# Patient Record
Sex: Female | Born: 1945
Health system: Southern US, Community
[De-identification: ages and names within clinical notes are randomized; demographics above are authoritative.]

## PROBLEM LIST (undated history)

## (undated) DIAGNOSIS — E119 Type 2 diabetes mellitus without complications: Secondary | ICD-10-CM

## (undated) DIAGNOSIS — K5790 Diverticulosis of intestine, part unspecified, without perforation or abscess without bleeding: Secondary | ICD-10-CM

## (undated) DIAGNOSIS — M170 Bilateral primary osteoarthritis of knee: Secondary | ICD-10-CM

## (undated) DIAGNOSIS — D126 Benign neoplasm of colon, unspecified: Secondary | ICD-10-CM

## (undated) DIAGNOSIS — M1990 Unspecified osteoarthritis, unspecified site: Secondary | ICD-10-CM

## (undated) DIAGNOSIS — F418 Other specified anxiety disorders: Secondary | ICD-10-CM

## (undated) DIAGNOSIS — Z9889 Other specified postprocedural states: Secondary | ICD-10-CM

## (undated) DIAGNOSIS — I10 Essential (primary) hypertension: Secondary | ICD-10-CM

## (undated) DIAGNOSIS — E66812 Obesity, class 2: Secondary | ICD-10-CM

## (undated) DIAGNOSIS — E669 Obesity, unspecified: Secondary | ICD-10-CM

## (undated) DIAGNOSIS — E785 Hyperlipidemia, unspecified: Secondary | ICD-10-CM

## (undated) HISTORY — DX: Hypercalcemia: E83.52

## (undated) HISTORY — DX: Other specified postprocedural states: Z98.890

## (undated) HISTORY — DX: Unspecified osteoarthritis, unspecified site: M19.90

## (undated) HISTORY — DX: Type 2 diabetes mellitus without complications: E11.9

## (undated) HISTORY — DX: Hyperlipidemia, unspecified: E78.5

## (undated) HISTORY — DX: Essential (primary) hypertension: I10

## (undated) HISTORY — PX: TONSILLECTOMY: SUR1361

## (undated) HISTORY — DX: Diverticulosis of intestine, part unspecified, without perforation or abscess without bleeding: K57.90

## (undated) HISTORY — DX: Benign neoplasm of colon, unspecified: D12.6

## (undated) HISTORY — PX: OTHER SURGICAL HISTORY: SHX169

## (undated) HISTORY — DX: Obesity, class 2: E66.812

## (undated) HISTORY — DX: Bilateral primary osteoarthritis of knee: M17.0

## (undated) HISTORY — DX: Other specified anxiety disorders: F41.8

## (undated) HISTORY — DX: Obesity, unspecified: E66.9

---

## 1995-12-04 HISTORY — PX: CHOLECYSTECTOMY: SHX55

## 1997-12-03 DIAGNOSIS — Z9889 Other specified postprocedural states: Secondary | ICD-10-CM

## 1997-12-03 HISTORY — DX: Other specified postprocedural states: Z98.890

## 2004-02-01 ENCOUNTER — Encounter: Admission: RE | Admit: 2004-02-01 | Discharge: 2004-02-01 | Payer: Self-pay | Admitting: Family Medicine

## 2004-03-06 ENCOUNTER — Other Ambulatory Visit: Admission: RE | Admit: 2004-03-06 | Discharge: 2004-03-06 | Payer: Self-pay | Admitting: Family Medicine

## 2005-03-08 ENCOUNTER — Ambulatory Visit: Payer: Self-pay | Admitting: Family Medicine

## 2005-03-22 ENCOUNTER — Other Ambulatory Visit: Admission: RE | Admit: 2005-03-22 | Discharge: 2005-03-22 | Payer: Self-pay | Admitting: Family Medicine

## 2005-03-22 ENCOUNTER — Ambulatory Visit: Payer: Self-pay | Admitting: Family Medicine

## 2005-03-29 ENCOUNTER — Encounter: Admission: RE | Admit: 2005-03-29 | Discharge: 2005-03-29 | Payer: Self-pay | Admitting: Family Medicine

## 2006-03-18 ENCOUNTER — Ambulatory Visit: Payer: Self-pay | Admitting: Family Medicine

## 2006-03-25 ENCOUNTER — Other Ambulatory Visit: Admission: RE | Admit: 2006-03-25 | Discharge: 2006-03-25 | Payer: Self-pay | Admitting: Family Medicine

## 2006-03-25 ENCOUNTER — Ambulatory Visit: Payer: Self-pay | Admitting: Family Medicine

## 2006-03-25 ENCOUNTER — Encounter: Payer: Self-pay | Admitting: Family Medicine

## 2006-04-03 ENCOUNTER — Encounter: Admission: RE | Admit: 2006-04-03 | Discharge: 2006-04-03 | Payer: Self-pay | Admitting: Family Medicine

## 2007-03-21 ENCOUNTER — Ambulatory Visit: Payer: Self-pay | Admitting: Family Medicine

## 2007-03-21 LAB — CONVERTED CEMR LAB
AST: 21 units/L (ref 0–37)
Albumin: 3.9 g/dL (ref 3.5–5.2)
Basophils Absolute: 0 10*3/uL (ref 0.0–0.1)
Bilirubin, Direct: 0.1 mg/dL (ref 0.0–0.3)
CO2: 29 meq/L (ref 19–32)
Chloride: 107 meq/L (ref 96–112)
Cholesterol: 173 mg/dL (ref 0–200)
Creatinine, Ser: 0.6 mg/dL (ref 0.4–1.2)
Eosinophils Absolute: 0.2 10*3/uL (ref 0.0–0.6)
Glucose, Bld: 113 mg/dL — ABNORMAL HIGH (ref 70–99)
HDL: 39.8 mg/dL (ref >39.0)
Hemoglobin: 14.1 g/dL (ref 12.0–15.0)
Lymphocytes Relative: 30.6 % (ref 12.0–46.0)
MCHC: 32.8 g/dL (ref 30.0–36.0)
MCV: 81.1 fL (ref 78.0–100.0)
Monocytes Absolute: 0.5 10*3/uL (ref 0.2–0.7)
Monocytes Relative: 11 % (ref 3.0–11.0)
Neutro Abs: 2.3 10*3/uL (ref 1.4–7.7)
Platelets: 132 10*3/uL — ABNORMAL LOW (ref 150–400)
Sodium: 143 meq/L (ref 135–145)
TSH: 2.73 microintl units/mL (ref 0.35–5.50)
Total Bilirubin: 0.8 mg/dL (ref 0.3–1.2)
Total Protein: 6.9 g/dL (ref 6.0–8.3)
Triglycerides: 104 mg/dL (ref 0–149)
VLDL: 21 mg/dL (ref 0–40)

## 2007-03-31 ENCOUNTER — Encounter: Payer: Self-pay | Admitting: Family Medicine

## 2007-03-31 ENCOUNTER — Other Ambulatory Visit: Admission: RE | Admit: 2007-03-31 | Discharge: 2007-03-31 | Payer: Self-pay | Admitting: Family Medicine

## 2007-03-31 ENCOUNTER — Ambulatory Visit: Payer: Self-pay | Admitting: Family Medicine

## 2007-04-15 ENCOUNTER — Encounter: Admission: RE | Admit: 2007-04-15 | Discharge: 2007-04-15 | Payer: Self-pay | Admitting: Family Medicine

## 2008-01-05 ENCOUNTER — Telehealth: Payer: Self-pay | Admitting: Family Medicine

## 2008-03-26 ENCOUNTER — Ambulatory Visit: Payer: Self-pay | Admitting: Family Medicine

## 2008-03-26 LAB — CONVERTED CEMR LAB
ALT: 41 units/L — ABNORMAL HIGH (ref 0–35)
AST: 30 units/L (ref 0–37)
Albumin: 4 g/dL (ref 3.5–5.2)
Alkaline Phosphatase: 67 units/L (ref 39–117)
BUN: 12 mg/dL (ref 6–23)
Blood in Urine, dipstick: NEGATIVE
CO2: 29 meq/L (ref 19–32)
Chloride: 106 meq/L (ref 96–112)
Eosinophils Relative: 2.8 % (ref 0.0–5.0)
Glucose, Bld: 123 mg/dL — ABNORMAL HIGH (ref 70–99)
HCT: 43.9 % (ref 36.0–46.0)
HDL: 38.8 mg/dL — ABNORMAL LOW (ref >39.0)
Monocytes Absolute: 0.4 10*3/uL (ref 0.1–1.0)
Monocytes Relative: 8.2 % (ref 3.0–12.0)
Neutrophils Relative %: 53.4 % (ref 43.0–77.0)
Nitrite: NEGATIVE
Platelets: 140 10*3/uL — ABNORMAL LOW (ref 150–400)
Potassium: 4.3 meq/L (ref 3.5–5.1)
Specific Gravity, Urine: >=1.03
Total CHOL/HDL Ratio: 4.1
Total Protein: 7 g/dL (ref 6.0–8.3)
WBC Urine, dipstick: NEGATIVE
WBC: 4.4 10*3/uL — ABNORMAL LOW (ref 4.5–10.5)

## 2008-04-02 ENCOUNTER — Encounter: Payer: Self-pay | Admitting: Family Medicine

## 2008-04-02 ENCOUNTER — Ambulatory Visit: Payer: Self-pay | Admitting: Family Medicine

## 2008-04-02 ENCOUNTER — Other Ambulatory Visit: Admission: RE | Admit: 2008-04-02 | Discharge: 2008-04-02 | Payer: Self-pay | Admitting: Family Medicine

## 2008-04-02 DIAGNOSIS — E119 Type 2 diabetes mellitus without complications: Secondary | ICD-10-CM

## 2008-04-02 DIAGNOSIS — K8020 Calculus of gallbladder without cholecystitis without obstruction: Secondary | ICD-10-CM | POA: Insufficient documentation

## 2008-04-02 DIAGNOSIS — F3289 Other specified depressive episodes: Secondary | ICD-10-CM | POA: Insufficient documentation

## 2008-04-02 DIAGNOSIS — F329 Major depressive disorder, single episode, unspecified: Secondary | ICD-10-CM

## 2008-04-02 DIAGNOSIS — I10 Essential (primary) hypertension: Secondary | ICD-10-CM

## 2008-04-02 DIAGNOSIS — E785 Hyperlipidemia, unspecified: Secondary | ICD-10-CM

## 2008-04-13 ENCOUNTER — Encounter: Payer: Self-pay | Admitting: Family Medicine

## 2008-04-16 ENCOUNTER — Encounter: Admission: RE | Admit: 2008-04-16 | Discharge: 2008-04-16 | Payer: Self-pay | Admitting: Family Medicine

## 2008-04-23 ENCOUNTER — Ambulatory Visit: Payer: Self-pay | Admitting: Family Medicine

## 2008-04-27 ENCOUNTER — Ambulatory Visit: Payer: Self-pay | Admitting: Family Medicine

## 2008-05-03 ENCOUNTER — Ambulatory Visit: Payer: Self-pay | Admitting: Family Medicine

## 2008-05-04 ENCOUNTER — Telehealth: Payer: Self-pay | Admitting: Family Medicine

## 2008-05-06 ENCOUNTER — Ambulatory Visit: Payer: Self-pay | Admitting: Family Medicine

## 2009-04-11 ENCOUNTER — Ambulatory Visit: Payer: Self-pay | Admitting: Family Medicine

## 2009-04-11 LAB — CONVERTED CEMR LAB
ALT: 36 units/L — ABNORMAL HIGH (ref 0–35)
AST: 26 units/L (ref 0–37)
Alkaline Phosphatase: 66 units/L (ref 39–117)
BUN: 17 mg/dL (ref 6–23)
Basophils Absolute: 0 10*3/uL (ref 0.0–0.1)
Bilirubin, Direct: 0 mg/dL (ref 0.0–0.3)
Calcium: 9.2 mg/dL (ref 8.4–10.5)
Cholesterol: 158 mg/dL (ref 0–200)
Eosinophils Relative: 2.9 % (ref 0.0–5.0)
GFR calc non Af Amer: 89.55 mL/min (ref >60)
HCT: 43.6 % (ref 36.0–46.0)
HDL: 43.3 mg/dL (ref >39.00)
LDL Cholesterol: 93 mg/dL (ref 0–99)
Lymphocytes Relative: 35.6 % (ref 12.0–46.0)
Lymphs Abs: 2.2 10*3/uL (ref 0.7–4.0)
Monocytes Relative: 8.3 % (ref 3.0–12.0)
Platelets: 146 10*3/uL — ABNORMAL LOW (ref 150.0–400.0)
Potassium: 4.4 meq/L (ref 3.5–5.1)
Sodium: 142 meq/L (ref 135–145)
TSH: 2.69 microintl units/mL (ref 0.35–5.50)
Total Bilirubin: 0.9 mg/dL (ref 0.3–1.2)
Urobilinogen, UA: 0.2
VLDL: 22 mg/dL (ref 0.0–40.0)
WBC Urine, dipstick: NEGATIVE
WBC: 6.3 10*3/uL (ref 4.5–10.5)

## 2009-04-26 ENCOUNTER — Ambulatory Visit: Payer: Self-pay | Admitting: Family Medicine

## 2009-04-26 ENCOUNTER — Encounter: Payer: Self-pay | Admitting: Family Medicine

## 2009-04-26 ENCOUNTER — Other Ambulatory Visit: Admission: RE | Admit: 2009-04-26 | Discharge: 2009-04-26 | Payer: Self-pay | Admitting: Family Medicine

## 2009-04-28 ENCOUNTER — Encounter: Admission: RE | Admit: 2009-04-28 | Discharge: 2009-04-28 | Payer: Self-pay | Admitting: Family Medicine

## 2009-04-29 ENCOUNTER — Telehealth (INDEPENDENT_AMBULATORY_CARE_PROVIDER_SITE_OTHER): Payer: Self-pay | Admitting: *Deleted

## 2009-10-18 ENCOUNTER — Ambulatory Visit: Payer: Self-pay | Admitting: Family Medicine

## 2009-10-18 DIAGNOSIS — K5732 Diverticulitis of large intestine without perforation or abscess without bleeding: Secondary | ICD-10-CM | POA: Insufficient documentation

## 2009-10-20 ENCOUNTER — Ambulatory Visit: Payer: Self-pay | Admitting: Family Medicine

## 2009-12-03 DIAGNOSIS — D126 Benign neoplasm of colon, unspecified: Secondary | ICD-10-CM

## 2009-12-03 HISTORY — DX: Benign neoplasm of colon, unspecified: D12.6

## 2010-04-11 ENCOUNTER — Ambulatory Visit: Payer: Self-pay | Admitting: Family Medicine

## 2010-04-11 ENCOUNTER — Encounter (INDEPENDENT_AMBULATORY_CARE_PROVIDER_SITE_OTHER): Payer: Self-pay | Admitting: *Deleted

## 2010-04-11 DIAGNOSIS — R1032 Left lower quadrant pain: Secondary | ICD-10-CM

## 2010-04-25 ENCOUNTER — Ambulatory Visit: Payer: Self-pay | Admitting: Family Medicine

## 2010-04-25 LAB — CONVERTED CEMR LAB
AST: 18 units/L (ref 0–37)
Alkaline Phosphatase: 57 units/L (ref 39–117)
Basophils Absolute: 0 10*3/uL (ref 0.0–0.1)
Basophils Relative: 0.7 % (ref 0.0–3.0)
Bilirubin, Direct: 0.1 mg/dL (ref 0.0–0.3)
Blood in Urine, dipstick: NEGATIVE
CO2: 26 meq/L (ref 19–32)
Calcium: 9 mg/dL (ref 8.4–10.5)
Creatinine, Ser: 0.6 mg/dL (ref 0.4–1.2)
Eosinophils Absolute: 0.1 10*3/uL (ref 0.0–0.7)
GFR calc non Af Amer: 115.48 mL/min (ref >60)
HDL: 42.3 mg/dL (ref >39.00)
Hemoglobin: 12 g/dL (ref 12.0–15.0)
Ketones, urine, test strip: NEGATIVE
LDL Cholesterol: 104 mg/dL — ABNORMAL HIGH (ref 0–99)
Lymphocytes Relative: 32.5 % (ref 12.0–46.0)
MCHC: 32.8 g/dL (ref 30.0–36.0)
Monocytes Relative: 7 % (ref 3.0–12.0)
Neutro Abs: 3.4 10*3/uL (ref 1.4–7.7)
Neutrophils Relative %: 57.4 % (ref 43.0–77.0)
Nitrite: NEGATIVE
RBC: 4.42 M/uL (ref 3.87–5.11)
Sodium: 138 meq/L (ref 135–145)
Total CHOL/HDL Ratio: 4
Triglycerides: 107 mg/dL (ref 0.0–149.0)
Urobilinogen, UA: 0.2
VLDL: 21.4 mg/dL (ref 0.0–40.0)
WBC Urine, dipstick: NEGATIVE

## 2010-05-02 ENCOUNTER — Encounter: Admission: RE | Admit: 2010-05-02 | Discharge: 2010-05-02 | Payer: Self-pay | Admitting: Family Medicine

## 2010-05-02 LAB — HM MAMMOGRAPHY

## 2010-05-05 ENCOUNTER — Ambulatory Visit: Payer: Self-pay | Admitting: Family Medicine

## 2010-05-05 ENCOUNTER — Other Ambulatory Visit: Admission: RE | Admit: 2010-05-05 | Discharge: 2010-05-05 | Payer: Self-pay | Admitting: Family Medicine

## 2010-05-11 ENCOUNTER — Encounter (INDEPENDENT_AMBULATORY_CARE_PROVIDER_SITE_OTHER): Payer: Self-pay | Admitting: *Deleted

## 2010-05-11 ENCOUNTER — Ambulatory Visit: Payer: Self-pay | Admitting: Gastroenterology

## 2010-05-26 ENCOUNTER — Ambulatory Visit: Payer: Self-pay | Admitting: Gastroenterology

## 2010-05-30 ENCOUNTER — Encounter: Payer: Self-pay | Admitting: Gastroenterology

## 2010-05-31 HISTORY — PX: COLONOSCOPY: SHX174

## 2010-06-19 ENCOUNTER — Telehealth: Payer: Self-pay | Admitting: Family Medicine

## 2010-08-10 ENCOUNTER — Telehealth: Payer: Self-pay | Admitting: Family Medicine

## 2011-01-02 NOTE — Assessment & Plan Note (Signed)
Summary: CPX // RS/PT [facility] FROM BMP/CJR/PT RSC/CJR   Vital Signs:  Patient profile:   65 year old female Menstrual status:  postmenopausal Height:      63.25 inches Weight:      207 pounds BMI:     36.51 Temp:     97.6 degrees F oral BP sitting:   120 / 80  (right arm) Cuff size:   regular  Vitals Entered By: Kathrynn Speed CMA (May 05, 2010 1:58 PM) CC: cpx Is Patient Diabetic? Yes Pain Assessment Patient in pain? no        CC:  cpx.  History of Present Illness: Catherine Daniel is a 65 year old, married female, nonsmoker, who comes in today for evaluation of hyperlipidemia, hypertension, anxiety, and postmenopausal vaginal dryness.  She takes Tenormin 25 mg daily, Norvasc, 10 mg daily, and Zestril 40 mg daily for hypertension.  BP at home 120/80.  She uses Premarin vaginal cream twice weekly for vaginal dryness.  She also takes Ativan 1 mg p.r.n. for anxiety.  She also takes Lipitor 20 mg nightly for hyperlipidemia.  Lipids are dull.  Here for Medicare AWV:  1.   Risk factors based on Past M, S, F history: none  2.   Physical Activities: walks daily 3.   Depression/mood:ok 4.   Hearing: normal 5.   ADL's: normal 6.   Fall Risk: none 7.   Home Safety: reviewed 8.   Height, weight, &visual acuity:unchanged.......Catherine Kitchenophthalmologist exam yearly 9.   Counseling: Catherine KitchenMarland Daniel was counseled to continue her medications, however, decrease her caloric intake to 1500 calories per day and continue walking 30 minutes.  A day.  Her current weight is 207 pounds. 10.   Labs ordered based on risk factors: done  11.           Referral Coordination 12.           Care Plan 13.            Cognitive Assessment   Allergies (verified): No Known Drug Allergies  Past History:  Past medical, surgical, family and social histories (including risk factors) reviewed, and no changes noted (except as noted below).  Past Medical History: Reviewed history from 04/02/2008 and no changes  required. Cholelithiasis Depression Diabetes mellitus, type II Hyperlipidemia Hypertension  childbirth x 2tonsillectomy cardiac catheter 1999, normal  Family History: Reviewed history from 04/02/2008 and no changes required. father died 90, mesothelioma mother in her 76s with coronary disease and hyperlipidemia two half-brothers one is diabetic two half-sisters one is diabetic.  One died of a motor vehicle accident  Social History: Reviewed history from 04/02/2008 and no changes required. Retired Married Never Smoked Alcohol use-no Drug use-no Regular exercise-no  Review of Systems      See HPI  Physical Exam  General:  Well-developed,well-nourished,in no acute distress; alert,appropriate and cooperative throughout examination Head:  Normocephalic and atraumatic without obvious abnormalities. No apparent alopecia or balding. Eyes:  No corneal or conjunctival inflammation noted. EOMI. Perrla. Funduscopic exam benign, without hemorrhages, exudates or papilledema. Vision grossly normal. Ears:  External ear exam shows no significant lesions or deformities.  Otoscopic examination reveals clear canals, tympanic membranes are intact bilaterally without bulging, retraction, inflammation or discharge. Hearing is grossly normal bilaterally. Nose:  External nasal examination shows no deformity or inflammation. Nasal mucosa are pink and moist without lesions or exudates. Mouth:  Oral mucosa and oropharynx without lesions or exudates.  Teeth in good repair. Neck:  No deformities, masses, or tenderness noted. Chest Wall:  No deformities, masses, or tenderness noted. Breasts:  No mass, nodules, thickening, tenderness, bulging, retraction, inflamation, nipple discharge or skin changes noted.   Lungs:  Normal respiratory effort, chest expands symmetrically. Lungs are clear to auscultation, no crackles or wheezes. Heart:  Normal rate and regular rhythm. S1 and S2 normal without gallop, murmur,  click, rub or other extra sounds. Abdomen:  Bowel sounds positive,abdomen soft and non-tender without masses, organomegaly or hernias noted. Genitalia:  Pelvic Exam:        External: normal female genitalia without lesions or masses        Vagina: normal without lesions or masses        Cervix: normal without lesions or masses        Adnexa: normal bimanual exam without masses or fullness        Uterus: normal by palpation        Pap smear: performed Msk:  No deformity or scoliosis noted of thoracic or lumbar spine.   Pulses:  R and L carotid,radial,femoral,dorsalis pedis and posterior tibial pulses are full and equal bilaterally Extremities:  No clubbing, cyanosis, edema, or deformity noted with normal full range of motion of all joints.   Neurologic:  No cranial nerve deficits noted. Station and gait are normal. Plantar reflexes are down-going bilaterally. DTRs are symmetrical throughout. Sensory, motor and coordinative functions appear intact. Skin:  Intact without suspicious lesions or rashes Cervical Nodes:  No lymphadenopathy noted Axillary Nodes:  No palpable lymphadenopathy Inguinal Nodes:  No significant adenopathy Psych:  Cognition and judgment appear intact. Alert and cooperative with normal attention span and concentration. No apparent delusions, illusions, hallucinations  Diabetes Management Exam:    Foot Exam (with socks and/or shoes not present):       Sensory-Pinprick/Light touch:          Left medial foot (L-4): normal          Left dorsal foot (L-5): normal          Left lateral foot (S-1): normal          Right medial foot (L-4): normal          Right dorsal foot (L-5): normal          Right lateral foot (S-1): normal       Sensory-Monofilament:          Left foot: normal          Right foot: normal       Inspection:          Left foot: normal          Right foot: normal       Nails:          Left foot: normal          Right foot: normal    Eye Exam:       Eye  Exam done elsewhere          Date: 03/20/2010          Results: normal          Done by: opth    Impression & Recommendations:  Problem # 1:  Preventive Health Care (ICD-V70.0) Assessment Unchanged  Problem # 2:  HYPERTENSION (ICD-401.9) Assessment: Improved  Her updated medication list for this problem includes:    Tenormin 25 Mg Tabs (Atenolol) .Catherine Kitchen... Take 1 tablet by mouth every morning    Norvasc 10 Mg Tabs (Amlodipine besylate) .Catherine Kitchen... Take 1 tablet by mouth every morning  Zestril 40 Mg Tabs (Lisinopril) .Catherine Kitchen... Take 1 tablet by mouth every morning  Orders: Prescription Created Electronically [id number]) First annual wellness visit with prevention plan  (Q0347) EKG w/ Interpretation (93000)  Problem # 3:  HYPERLIPIDEMIA (ICD-272.4) Assessment: Improved  The following medications were removed from the medication list:    Lipitor 20 Mg Tabs (Atorvastatin calcium) ..... One tab daily Her updated medication list for this problem includes:    Lipitor 20 Mg Tabs (Atorvastatin calcium) .Catherine Kitchen... Take one tab by mouth at bedtime  Orders: Prescription Created Electronically [phone]) First annual wellness visit with prevention plan  (G3875)  Problem # 4:  DIABETES MELLITUS, TYPE II (ICD-250.00) Assessment: Improved  The following medications were removed from the medication list:    Ecotrin 325 Mg Tbec (Aspirin) ..... Once daily Her updated medication list for this problem includes:    Zestril 40 Mg Tabs (Lisinopril) .Catherine Kitchen... Take 1 tablet by mouth every morning  Orders: Prescription Created Electronically [phone]) First annual wellness visit with prevention plan  (R5188)  Problem # 5:  DEPRESSION (ICD-311) Assessment: Improved  Her updated medication list for this problem includes:    Ativan 1 Mg Tabs (Lorazepam) .Catherine Kitchen... 1 by mouth as needed anxiety  Orders: Prescription Created Electronically [phone]) First annual wellness visit with prevention plan  (Y3016)  Complete Medication  List: 1)  Fish Oil Oil (Fish oil) 2)  Tenormin 25 Mg Tabs (Atenolol) .... Take 1 tablet by mouth every morning 3)  Premarin 0.625 Mg/gm Crea (Estrogens, conjugated) .... Apply 2 x week 4)  Norvasc 10 Mg Tabs (Amlodipine besylate) .... Take 1 tablet by mouth every morning 5)  Zestril 40 Mg Tabs (Lisinopril) .... Take 1 tablet by mouth every morning 6)  Vitamin D3 10000 Unit Caps (Cholecalciferol) .... Once daily 7)  Ativan 1 Mg Tabs (Lorazepam) .Catherine Kitchen.. 1 by mouth as needed anxiety 8)  Lipitor 20 Mg Tabs (Atorvastatin calcium) .... Take one tab by mouth at bedtime  Patient Instructions: 1)  tried to decrease her caloric intake to 1500 calories daily, drink, 30 ounces of water daily, walk 30 minutes daily.  This will help you lose weight increase your energy level and improve her cardiovascular fitness.  Also will help your bone strength. 2)  Please schedule a follow-up appointment in 1 year. 3)  Take an Aspirin every day. Prescriptions: ATIVAN 1 MG TABS (LORAZEPAM) 1 by mouth as needed anxiety  #50 x 3   Entered and Authorized by:   Roderick Pee MD   Signed by:   Roderick Pee MD on 05/05/2010   Method used:   Print then Give to Patient   RxID:   [id number] ZESTRIL 40 MG  TABS (LISINOPRIL) Take 1 tablet by mouth every morning  #100 x 4   Entered and Authorized by:   Roderick Pee MD   Signed by:   Roderick Pee MD on 05/05/2010   Method used:   Electronically to        [facility]  [address] [phone]* (retail)       [address] [place], [place]  [address]       Ph: [phone] or [phone]       Fax: [phone]   RxID:   [id number] NORVASC 10 MG  TABS (AMLODIPINE BESYLATE) Take 1 tablet by mouth every morning  #100 x 4   Entered and Authorized by:   Eugenio Hoes  Todd MD   Signed by:   Roderick Pee MD on 05/05/2010   Method used:   Electronically to        [facility]  [address] [phone]* (retail)       [address] [place], [place]  [address]        Ph: [phone] or [phone]       Fax: [phone]   RxID:   [id number] PREMARIN 0.625 MG/GM  CREA (ESTROGENS, CONJUGATED) apply 2 x week  #3 tubes x 6   Entered and Authorized by:   Roderick Pee MD   Signed by:   Roderick Pee MD on 05/05/2010   Method used:   [facility]ically to        [facility]  [address] [phone]* (retail)       [address] [place], [place]  [address]       Ph: [phone] or [phone]       Fax: [phone]   RxID:   [id number] TENORMIN 25 MG  TABS (ATENOLOL) Take 1 tablet by mouth every morning  #100 x 4   Entered and Authorized by:   Roderick Pee MD   Signed by:   Roderick Pee MD on 05/05/2010   Method used:   [facility]ronically to        [facility]  [address] [phone]* (retail)       [address] [place], [place]  [address]       Ph: [phone] or [phone]       Fax: [phone]   RxID:   [id number]

## 2011-01-02 NOTE — Letter (Signed)
Summary: [facility] Instructions  [facility]  [address] [place], [place] [address]   Phone: [phone]  Fax: [phone]       Catherine Daniel    06/22/1946    MRN: [id number]        Procedure Day /Date: Friday, 05/26/10     Arrival Time: 9:30      Procedure Time: 10:30     Location of Procedure:                    _X _  [facility] (4th Floor)                        PREPARATION FOR COLONOSCOPY WITH MOVIPREP   Starting 5 days prior to your procedure 05/21/10 do not eat nuts, seeds, popcorn, corn, beans, peas,  salads, or any raw vegetables.  Do not take any fiber supplements (e.g. Metamucil, Citrucel, and Benefiber).  THE DAY BEFORE YOUR PROCEDURE         DATE: 05/25/10   DAY: Thursday  1.  Drink clear liquids the entire day-NO SOLID FOOD  2.  Do not drink anything colored red or purple.  Avoid juices with pulp.  No orange juice.  3.  Drink at least 64 oz. (8 glasses) of fluid/clear liquids during the day to prevent dehydration and help the prep work efficiently.  CLEAR LIQUIDS INCLUDE: Water Jello Ice Popsicles Tea (sugar ok, no milk/cream) Powdered fruit flavored drinks Coffee (sugar ok, no milk/cream) Gatorade Juice: apple, white grape, white cranberry  Lemonade Clear bullion, consomm, broth Carbonated beverages (any kind) Strained chicken noodle soup Hard Candy                             4.  In the morning, mix first dose of MoviPrep solution:    Empty 1 Pouch A and 1 Pouch B into the disposable container    Add lukewarm drinking water to the top line of the container. Mix to dissolve    Refrigerate (mixed solution should be used within 24 hrs)  5.  Begin drinking the prep at 5:00 p.m. The MoviPrep container is divided by 4 marks.   Every 15 minutes drink the solution down to the next mark (approximately 8 oz) until the full liter is complete.   6.  Follow completed prep with 16 oz of clear liquid of your choice (Nothing  red or purple).  Continue to drink clear liquids until bedtime.  7.  Before going to bed, mix second dose of MoviPrep solution:    Empty 1 Pouch A and 1 Pouch B into the disposable container    Add lukewarm drinking water to the top line of the container. Mix to dissolve    Refrigerate  THE DAY OF YOUR PROCEDURE      DATE: 05/26/10   DAY: Friday  Beginning at 5:30 a.m. (5 hours before procedure):         1. Every 15 minutes, drink the solution down to the next mark (approx 8 oz) until the full liter is complete.  2. Follow completed prep with 16 oz. of clear liquid of your choice.    3. You may drink clear liquids until 8:30 (2 HOURS BEFORE PROCEDURE).   MEDICATION INSTRUCTIONS  Unless otherwise instructed, you should take regular prescription medications with a small sip of water   as early as possible the morning  of your procedure.           _        OTHER INSTRUCTIONS  You will need a responsible adult at least 65 years of age to accompany you and drive you home.   This person must remain in the waiting room during your procedure.  Wear loose fitting clothing that is easily removed.  Leave jewelry and other valuables at home.  However, you may wish to bring a book to read or  an iPod/MP3 player to listen to music as you wait for your procedure to start.  Remove all body piercing jewelry and leave at home.  Total time from sign-in until discharge is approximately 2-3 hours.  You should go home directly after your procedure and rest.  You can resume normal activities the  day after your procedure.  The day of your procedure you should not:   Drive   Make legal decisions   Operate machinery   Drink alcohol   Return to work  You will receive specific instructions about eating, activities and medications before you leave.    The above instructions have been reviewed and explained to me by   _______________________    I fully understand and can  verbalize these instructions _____________________________ Date _________

## 2011-01-02 NOTE — Progress Notes (Signed)
Summary: Refill [facility]  Phone Note Refill Request Message from:  Pharmacy on August 10, 2010 11:32 AM  Refills Requested: Medication #1:  LIPITOR 20 MG TABS take one tab by mouth at bedtime   Dosage confirmed as above?Dosage Confirmed Initial call taken by: Kathrynn Speed CMA,  August 10, 2010 11:32 AM    Prescriptions: LIPITOR 20 MG TABS (ATORVASTATIN CALCIUM) take one tab by mouth at bedtime  #90 x 1   Entered by:   Kathrynn Speed CMA   Authorized by:   Roderick Pee MD   Signed by:   Kathrynn Speed CMA on 08/10/2010   Method used:   [facility] to        [facility]  [address] [phone]* (retail)       [address] [place], [place]  [address]       Ph: [phone] or [phone]       Fax: [phone]   RxID:   [id number]

## 2011-01-02 NOTE — Assessment & Plan Note (Signed)
Summary: LLQ ABD PAIN SINCE JANUARY/YF   History of Present Illness Visit Type: consult  Primary GI MD: Sheryn Bison MD FACP FAGA Primary Provider: Eugenio Hoes. Tawanna Cooler, MD Requesting Provider: Eugenio Hoes. Tawanna Cooler, MD Chief Complaint: LLQ abd pain, loose stools, hemorrhoids History of Present Illness:   65 year old Caucasian female who had an episode of diverticulitis in November of 2011 that responded to p.o. Cipro and metronidazole per Dr. Kelle Darting. She had a mild relapse in January and again responded to antibiotic therapy. Since that time she's had intermittent lower, cramping, diarrhea, no rectal bleeding. She denies a systemic complaints, fever chills. She denies any specific food intolerances.  She apparently had colonoscopy in [place] in 2004 which showed diverticulosis. She recently has noticed an association of her symptoms with aspirin use, has discontinued this medication with marked improvement. She denies upper gastrointestinal or hepatobiliary complaints. She has had previous cholecystectomy. Medical problems include glucose intolerance, hyperlipidemia, and hypertension. Review of her chart shows no evidence of anemia or abnormal liver function tests. Family history is noncontributory.   GI Review of Systems      Denies abdominal pain, acid reflux, belching, bloating, chest pain, dysphagia with liquids, dysphagia with solids, heartburn, loss of appetite, nausea, vomiting, vomiting blood, weight loss, and  weight gain.      Reports diverticulosis and  hemorrhoids.     Denies anal fissure, black tarry stools, change in bowel habit, constipation, diarrhea, fecal incontinence, heme positive stool, irritable bowel syndrome, jaundice, light color stool, liver problems, rectal bleeding, and  rectal pain.    Current Medications (verified): 1)  Fish Oil   Oil (Fish Oil) .... 1000mg  Once Daily By Mouth 2)  Tenormin 25 Mg  Tabs (Atenolol) .... Take 1 Tablet By Mouth Every Morning 3)   Premarin 0.625 Mg/gm  Crea (Estrogens, Conjugated) .... Apply 2 X Week 4)  Norvasc 5 Mg Tabs (Amlodipine Besylate) .... One Tablet By Mouth Once Daily 5)  Zestril 20 Mg Tabs (Lisinopril) .... One Tablet By Mouth Once Daily 6)  Vitamin D3 10000 Unit Caps (Cholecalciferol) .... Once Daily 7)  Ativan 1 Mg Tabs (Lorazepam) .Marland Kitchen.. 1 By Mouth As Needed Anxiety 8)  Lipitor 20 Mg Tabs (Atorvastatin Calcium) .... Take One Tab By Mouth At Bedtime  Allergies (verified): No Known Drug Allergies  Past History:  Past medical, surgical, family and social histories (including risk factors) reviewed for relevance to current acute and chronic problems.  Past Medical History: Cholelithiasis Depression Diabetes mellitus, type II Hyperlipidemia Hypertension childbirth x 2tonsillectomy cardiac catheter 1999, normal Diverticulosis  Past Surgical History: Cholecystectomy  Family History: Reviewed history from 04/02/2008 and no changes required. father died 36, mesothelioma mother in her 38s with coronary disease and hyperlipidemia two half-brothers one is diabetic two half-sisters one is diabetic.  One died of a motor vehicle accident No FH of Colon Cancer:  Social History: Reviewed history from 04/02/2008 and no changes required. Retired Married Alcohol use-no Drug use-no Regular exercise-no Patient is a former smoker.  Daily Caffeine Use: 2 daily  Smoking Status:  quit  Review of Systems  The patient denies allergy/sinus, anemia, anxiety-new, arthritis/joint pain, back pain, blood in urine, breast changes/lumps, change in vision, confusion, cough, coughing up blood, depression-new, fainting, fatigue, fever, headaches-new, hearing problems, heart murmur, heart rhythm changes, itching, menstrual pain, muscle pains/cramps, night sweats, nosebleeds, pregnancy symptoms, shortness of breath, skin rash, sleeping problems, sore throat, swelling of feet/legs, swollen lymph glands, thirst - excessive ,  urination - excessive ,  urination changes/pain, urine leakage, vision changes, and voice change.    Vital Signs:  Patient profile:   65 year old female Menstrual status:  postmenopausal Height:      63.25 inches Weight:      206 pounds BMI:     36.33 BSA:     1.97 Pulse rate:   64 / minute Pulse rhythm:   regular BP sitting:   124 / 76  (left arm) Cuff size:   regular  Vitals Entered By: Ok Anis CMA (May 11, 2010 9:09 AM)  Physical Exam  General:  Well developed, well nourished, no acute distress.healthy appearing.   Head:  Normocephalic and atraumatic. Eyes:  PERRLA, no icterus.exam deferred to patient's ophthalmologist.   Neck:  Supple; no masses or thyromegaly. Lungs:  Clear throughout to auscultation. Heart:  Regular rate and rhythm; no murmurs, rubs,  or bruits. Abdomen:  Soft, nontender and nondistended. No masses, hepatosplenomegaly or hernias noted. Normal bowel sounds. Rectal:  Normal exam.hemocult negative.   Msk:  Symmetrical with no gross deformities. Normal posture. Pulses:  Normal pulses noted. Extremities:  No clubbing, cyanosis, edema or deformities noted. Neurologic:  Alert and  oriented x4;  grossly normal neurologically. Skin:  Intact without significant lesions or rashes. Cervical Nodes:  No significant cervical adenopathy. Psych:  Alert and cooperative. Normal mood and affect.   Impression & Recommendations:  Problem # 1:  ABDOMINAL PAIN, LEFT LOWER QUADRANT (ICD-789.04) Assessment Improved Recurrent diverticulitis and a middle-aged female who has symptomatic diverticulosis. I have scheduled her for followup colonoscopy, placed on a high fiber diet with fiber supplementation and also probiotic therapy, and p.r.n. Levsin use. She saw our patient education movie on diverticulosis and its management.Also, colonoscopy has been reviewed in detail. I think she should tolerate a balanced electrolyte preparation without difficulties. She may have mild  segmental colitis associated with a chronic diverticulosis. Colonoscopy screen also indicated for colon cancer-polyposis surveillance.  Problem # 2:  DIVERTICULITIS, COLON (ICD-562.11) Assessment: Comment Only  Problem # 3:  HYPERTENSION (ICD-401.9) Assessment: Improved Blood Pressure Today 124/76 and she's been asked to continue all her other medications as per Dr. Tawanna Cooler she is now also slight therapy which I have asked to continue to avoid until her workup and clinical course has been completed.  Patient Instructions: 1)  High Fiber diet brochure given.  2)  Begin benefiber daily.  OTC. 3)  Begin [facility] two times a day. 4)  A prescription for levsin has ben sent to your pharmacy to use as needed. 5)  You are scheduled for a colonoscopy. 6)  The medication list was reviewed and reconciled.  All changed / newly prescribed medications were explained.  A complete medication list was provided to the patient / caregiver. 7)  Please continue current medications.  8)  Colonoscopy and Flexible Sigmoidoscopy brochure given.  9)  Conscious Sedation brochure given.  10)  Diverticular Disease brochure given.  11)  Copy sent to : Dr. Kelle Darting. 12)  Diet should be high in fiber ( fruits, vegetables, whole grains) but low in residue. Drink at least eight (8) glasses of water a day.   Appended Document: LLQ ABD PAIN SINCE JANUARY/YF    Clinical Lists Changes  Medications: Added new medication of LEVSIN/SL 0.125 MG  SUBL (HYOSCYAMINE SULFATE) 1 SL 1 4-6 hrs as needed - Signed Added new medication of MOVIPREP 100 GM  SOLR (PEG-KCL-NACL-NASULF-NA ASC-C) As per prep instructions. - Signed Rx of LEVSIN/SL 0.125 MG  SUBL (HYOSCYAMINE SULFATE) 1 SL 1 4-6 hrs as needed;  #60 x 3;  Signed;  Entered by: Ashok Cordia RN;  Authorized by: Mardella Layman MD [facility];  Method used: [facility] [phone] [address] [place], [place], [place]  [address], Ph: [phone] or  [phone], Fax: [phone] Rx of MOVIPREP 100 GM  SOLR (PEG-KCL-NACL-NASULF-NA ASC-C) As per prep instructions.;  #1 x 0;  Signed;  Entered by: Ashok Cordia RN;  Authorized by: Mardella Layman MD [facility];  Method used: [facility]ically [facility]  [address] [phone] [address], [place], [place], [place]  [address], Ph: [phone] or [phone], Fax: [phone] Orders: Added new Test order of Colonoscopy (Colon) - Signed    Prescriptions: MOVIPREP 100 GM  SOLR (PEG-KCL-NACL-NASULF-NA ASC-C) As per prep instructions.  #1 x 0   Entered by:   Ashok Cordia RN   Authorized by:   Mardella Layman MD [facility]   Signed by:   Ashok Cordia RN on 05/11/2010   Method used:   [facility]        [facility]  [address] [phone])       [address] [place], [place]  [address]       Ph: [phone] or [phone]       Fax: [phone]   RxID:   [id number] LEVSIN/SL 0.125 MG  SUBL (HYOSCYAMINE SULFATE) 1 SL 1 4-6 hrs as needed  #60 x 3   Entered by:   Ashok Cordia RN   Authorized by:   Mardella Layman MD [facility]   Signed by:   Ashok Cordia RN on 05/11/2010   Method used:   [facility]        [facility]  [address] [phone][facility] ([facility]ail)       [address] [place], [place]  [address]       Ph: [phone] or [phone]       Fax: [phone]   RxID:   [id number]    Appended Document: LLQ ABD PAIN SINCE JANUARY/YF    Clinical Lists Changes  Medications: Added new medication of PHILLIPS COLON HEALTH  CAPS (PROBIOTIC PRODUCT) two times a day Added new medication of BENEFIBER  POWD (WHEAT DEXTRIN) daily

## 2011-01-02 NOTE — Procedures (Signed)
Summary: Colonoscopy  Patient: [name] Note: All result statuses are Final unless otherwise noted.  Tests: (1) Colonoscopy (COL)   COL Colonoscopy           DONE     [facility]     [address]     [place], [place]  [address]           COLONOSCOPY PROCEDURE REPORT           PATIENT:  Catherine, Daniel  MR#:  [id number]     BIRTHDATE:  [birth year], 64 yrs. old  GENDER:  female     ENDOSCOPIST:  Vania Rea. Jarold Motto, MD, [facility]     REF. BY:     PROCEDURE DATE:  05/26/2010     PROCEDURE:  Colonoscopy with snare polypectomy     ASA CLASS:  Class II     INDICATIONS:  colorectal cancer screening, average risk RECENT     DIVERTICULITIS.     MEDICATIONS:   Fentanyl 75 mcg IV, Versed 7 mg IV           DESCRIPTION OF PROCEDURE:   After the risks benefits and     alternatives of the procedure were thoroughly explained, informed     consent was obtained.  Digital rectal exam was performed and     revealed no abnormalities.   The LB CF-[id number] endoscope     was introduced through the anus and advanced to the cecum, which     was identified by both the appendix and ileocecal valve, without     limitations.  The quality of the prep was excellent, using     MoviPrep.  The instrument was then slowly withdrawn as the colon     was fully examined.     <<PROCEDUREIMAGES>>           FINDINGS:  Severe diverticulosis was found in the sigmoid to     descending colon segments.  A sessile polyp was found. FLAT     POLYP HOT SNARE EXCISED.  A diminutive polyp was found in the     rectum. CAUTERIZED,,,  This was otherwise a normal examination of     the colon.   Retroflexed views in the rectum revealed no     abnormalities.    The scope was then withdrawn from the patient     and the procedure completed.           COMPLICATIONS:  None     ENDOSCOPIC IMPRESSION:     1) Severe diverticulosis in the sigmoid to descending colon     segments     2) Sessile polyp     3) Diminutive  polyp in the rectum     4) Otherwise normal examination     R/O ADENOMA     RECOMMENDATIONS:     1) high fiber diet     2) metamucil or benefiber     3) Follow up colonoscopy in 5 years     REPEAT EXAM:  No           ______________________________     Vania Rea. Jarold Motto, MD, [facility]           CC:  Roderick Pee, MD           n.     Catherine Daniel:   Vania Rea. Patterson at 05/26/2010 11:11 AM           Catherine, Daniel, [id number]  Note: An exclamation  mark (!) indicates a result that was not dispersed into the flowsheet. Document Creation Date: 05/26/2010 11:13 AM _______________________________________________________________________  (1) Order result status: Final Collection or observation date-time: 05/26/2010 11:05 Requested date-time:  Receipt date-time:  Reported date-time:  Referring Physician:   Ordering Physician: Sheryn Bison [phone]) Specimen Source:  Source: Launa Grill Order Number: [id number] Lab site:   Appended Document: Colonoscopy     Procedures Next Due Date:    Colonoscopy: 05/2015

## 2011-01-02 NOTE — Letter (Signed)
Summary: Patient Notice- Polyp Results  [facility]  [address] [place], [place] [address]   Phone: [phone]  Fax: [phone]        May 30, 2010 MRN: [id number]    Catherine Daniel [address] [place], [place]  [address]    Dear Ms. Signer,  I am pleased to inform you that the colon polyp(s) removed during your recent colonoscopy was (were) found to be benign (no cancer detected) upon pathologic examination.  I recommend you have a repeat colonoscopy examination in 5_ years to look for recurrent polyps, as having colon polyps increases your risk for having recurrent polyps or even colon cancer in the future.  Should you develop new or worsening symptoms of abdominal pain, bowel habit changes or bleeding from the rectum or bowels, please schedule an evaluation with either your primary care physician or with me.  Additional information/recommendations:  __ No further action with gastroenterology is needed at this time. Please      follow-up with your primary care physician for your other healthcare      needs.  __ Please call [phone] to schedule a return visit to review your      situation.  __ Please keep your follow-up visit as already scheduled.  _x_ Continue treatment plan as outlined the day of your exam.  Please call us if you are having persistent problems or have questions about your condition that have not been fully answered at this time.  Sincerely,  Mardella Layman MD [facility]  This letter has been electronically signed by your physician.  Appended Document: Patient Notice- Polyp Results letter mailed 7.6.11

## 2011-01-02 NOTE — Letter (Signed)
Summary: New Patient letter  [facility]  [address] [place], [place] [address]   Phone: [phone]  Fax: [phone]       04/11/2010 MRN: [id number]  Catherine Daniel [address] [place], [place]  [address]  Dear Ms. Ozment,  Welcome to the Gastroenterology Division at [facility].    You are scheduled to see Dr.  Jarold Motto on 05-11-10 at 9:30am on the 3rd floor at [facility], [address]  We ask that you try to arrive at our office 15 minutes prior to your appointment time to allow for check-in.  We would like you to complete the enclosed self-administered evaluation form prior to your visit and bring it with you on the day of your appointment.  We will review it with you.  Also, please bring a complete list of all your medications or, if you prefer, bring the medication bottles and we will list them.  Please bring your insurance card so that we may make a copy of it.  If your insurance requires a referral to see a specialist, please bring your referral form from your primary care physician.  Co-payments are due at the time of your visit and may be paid by cash, check or credit card.     Your office visit will consist of a consult with your physician (includes a physical exam), any laboratory testing he/she may order, scheduling of any necessary diagnostic testing (e.g. x-ray, ultrasound, CT-scan), and scheduling of a procedure (e.g. Endoscopy, Colonoscopy) if required.  Please allow enough time on your schedule to allow for any/all of these possibilities.    If you cannot keep your appointment, please call [phone] to cancel or reschedule prior to your appointment date.  This allows [place] the opportunity to schedule an appointment for another patient in need of care.  If you do not cancel or reschedule by 5 p.m. the business day prior to your appointment date, you will be charged a $50.00 late cancellation/no-show fee.    Thank you for choosing  [facility] for your medical needs.  We appreciate the opportunity to care for you.  Please visit [place] at our website  to learn more about our practice.                     Sincerely,                                                             The Gastroenterology Division

## 2011-01-02 NOTE — Assessment & Plan Note (Signed)
Summary: stomach problems//alp   Vital Signs:  Patient profile:   65 year old female Menstrual status:  postmenopausal Weight:      204 pounds Temp:     98.0 degrees F oral BP sitting:   120 / 84  (left arm) Cuff size:   regular  Vitals Entered By: Kern Reap CMA Duncan Dull) (Apr 11, 2010 12:12 PM) CC: lower abdominal pain   CC:  lower abdominal pain.  History of Present Illness: Catherine Daniel is a 65 year old, married female, nonsmoker comes in today for evaluation of left lower quadrant abdominal pain.  She had a colonoscopy prior to moving to [place] in [place], which showed some left-sided diverticuli.  In November.  She had an episode of diverticulitis, which we treated with Cipro and Flagyl.  Pain went away.  She felt fine until January when the pain recurred.  She took another round of Cipro and Flagyl.  However, this time.  The pain did go away.  She describes the pain as constant, sharp, a 6 on a scale of one to 10 and points to her left lower quadrant as a source for pain.  She has no fever, chills, vomiting, or weight loss.  No urinary tract symptoms.  She has 4 loose bowel movements per day.  Allergies: No Known Drug Allergies  Past History:  Past medical, surgical, family and social histories (including risk factors) reviewed for relevance to current acute and chronic problems.  Past Medical History: Reviewed history from 04/02/2008 and no changes required. Cholelithiasis Depression Diabetes mellitus, type II Hyperlipidemia Hypertension  childbirth x 2tonsillectomy cardiac catheter 1999, normal  Family History: Reviewed history from 04/02/2008 and no changes required. father died 87, mesothelioma mother in her 13s with coronary disease and hyperlipidemia two half-brothers one is diabetic two half-sisters one is diabetic.  One died of a motor vehicle accident  Social History: Reviewed history from 04/02/2008 and no changes required. Retired Married Never  Smoked Alcohol use-no Drug use-no Regular exercise-no  Review of Systems      See HPI  Physical Exam  General:  Well-developed,well-nourished,in no acute distress; alert,appropriate and cooperative throughout examination Abdomen:  Bowel sounds positive,abdomen soft and non-tender without masses, organomegaly or hernias noted.   Problems:  Medical Problems Added: 1)  Dx of Abdominal Pain, Left Lower Quadrant  (ICD-789.04)  Impression & Recommendations:  Problem # 1:  ABDOMINAL PAIN, LEFT LOWER QUADRANT (ICD-789.04) Assessment New  Orders: Gastroenterology Referral (GI)  Complete Medication List: 1)  Lipitor 20 Mg Tabs (Atorvastatin calcium) .... One tab daily 2)  Fish Oil Oil (Fish oil) 3)  Tenormin 25 Mg Tabs (Atenolol) .... Take 1 tablet by mouth every morning 4)  Premarin 0.625 Mg/gm Crea (Estrogens, conjugated) .... Apply 2 x week 5)  Norvasc 10 Mg Tabs (Amlodipine besylate) .... Take 1 tablet by mouth every morning 6)  Zestril 40 Mg Tabs (Lisinopril) .... Take 1 tablet by mouth every morning 7)  Ecotrin 325 Mg Tbec (Aspirin) .... Once daily 8)  Vitamin D3 10000 Unit Caps (Cholecalciferol) .... Once daily 9)  Ativan 1 Mg Tabs (Lorazepam) .Marland Kitchen.. 1 by mouth as needed anxiety  Patient Instructions: 1)  I will call GI ...........Marland Kitchen Appointment ASAP 2)  stop the aspirin, completely

## 2011-01-02 NOTE — Progress Notes (Signed)
Summary: ASA ?  Phone Note Call from Patient   Caller: Patient Call For: Roderick Pee MD Summary of Call: Pt stopped the ASA and is curious if she should go back on ASA and what mg??? [phone] Initial call taken by: Lynann Beaver CMA,  June 19, 2010 11:15 AM  Follow-up for Phone Call        81 mg daily  Pt. notified. Lynann Beaver [facility]  June 19, 2010 11:40 AM  Follow-up by: Roderick Pee MD,  June 19, 2010 11:31 AM

## 2011-02-03 ENCOUNTER — Other Ambulatory Visit: Payer: Self-pay | Admitting: Family Medicine

## 2011-04-25 ENCOUNTER — Other Ambulatory Visit: Payer: Self-pay | Admitting: Family Medicine

## 2011-04-25 DIAGNOSIS — Z1231 Encounter for screening mammogram for malignant neoplasm of breast: Secondary | ICD-10-CM

## 2011-05-01 ENCOUNTER — Other Ambulatory Visit (INDEPENDENT_AMBULATORY_CARE_PROVIDER_SITE_OTHER): Payer: Medicare Other

## 2011-05-01 DIAGNOSIS — Z0000 Encounter for general adult medical examination without abnormal findings: Secondary | ICD-10-CM

## 2011-05-01 LAB — LIPID PANEL
Cholesterol: 132 mg/dL (ref 0–200)
HDL: 40.9 mg/dL (ref >39.00)
Triglycerides: 79 mg/dL (ref 0.0–149.0)
VLDL: 15.8 mg/dL (ref 0.0–40.0)

## 2011-05-01 LAB — POCT URINALYSIS DIPSTICK
Blood, UA: NEGATIVE
Ketones, UA: NEGATIVE
Spec Grav, UA: 1.03

## 2011-05-01 LAB — TSH: TSH: 2.24 u[IU]/mL (ref 0.35–5.50)

## 2011-05-01 LAB — BASIC METABOLIC PANEL
BUN: 13 mg/dL (ref 6–23)
CO2: 27 mEq/L (ref 19–32)
Glucose, Bld: 109 mg/dL — ABNORMAL HIGH (ref 70–99)
Potassium: 4.8 mEq/L (ref 3.5–5.1)
Sodium: 141 mEq/L (ref 135–145)

## 2011-05-01 LAB — CBC WITH DIFFERENTIAL/PLATELET
Basophils Absolute: 0 10*3/uL (ref 0.0–0.1)
Eosinophils Absolute: 0.1 10*3/uL (ref 0.0–0.7)
HCT: 39.2 % (ref 36.0–46.0)
Hemoglobin: 13 g/dL (ref 12.0–15.0)
Lymphs Abs: 1.9 10*3/uL (ref 0.7–4.0)
MCHC: 33.2 g/dL (ref 30.0–36.0)
MCV: 81 fl (ref 78.0–100.0)
Monocytes Absolute: 0.5 10*3/uL (ref 0.1–1.0)
Neutro Abs: 3 10*3/uL (ref 1.4–7.7)
Platelets: 159 10*3/uL (ref 150.0–400.0)
RDW: 14.3 % (ref 11.5–14.6)

## 2011-05-01 LAB — HEPATIC FUNCTION PANEL
AST: 20 U/L (ref 0–37)
Albumin: 3.9 g/dL (ref 3.5–5.2)
Total Bilirubin: 0.7 mg/dL (ref 0.3–1.2)

## 2011-05-04 ENCOUNTER — Encounter: Payer: Self-pay | Admitting: Family Medicine

## 2011-05-04 ENCOUNTER — Ambulatory Visit
Admission: RE | Admit: 2011-05-04 | Discharge: 2011-05-04 | Disposition: A | Discharge status: Home / Self Care | Payer: Medicare Other | Source: Ambulatory Visit | Attending: Family Medicine | Admitting: Family Medicine

## 2011-05-04 DIAGNOSIS — Z1231 Encounter for screening mammogram for malignant neoplasm of breast: Secondary | ICD-10-CM

## 2011-05-07 ENCOUNTER — Encounter: Payer: Self-pay | Admitting: Family Medicine

## 2011-05-07 ENCOUNTER — Ambulatory Visit (INDEPENDENT_AMBULATORY_CARE_PROVIDER_SITE_OTHER): Payer: Medicare Other | Admitting: Family Medicine

## 2011-05-07 VITALS — BP 110/80 | Temp 98.1°F | Ht 63.5 in | Wt 204.0 lb

## 2011-05-07 DIAGNOSIS — Z23 Encounter for immunization: Secondary | ICD-10-CM

## 2011-05-07 DIAGNOSIS — F329 Major depressive disorder, single episode, unspecified: Secondary | ICD-10-CM

## 2011-05-07 DIAGNOSIS — E785 Hyperlipidemia, unspecified: Secondary | ICD-10-CM

## 2011-05-07 DIAGNOSIS — I10 Essential (primary) hypertension: Secondary | ICD-10-CM

## 2011-05-07 MED ORDER — ATORVASTATIN CALCIUM 20 MG PO TABS: 20.0000 mg | ORAL_TABLET | Freq: Every day | ORAL | Status: DC

## 2011-05-07 MED ORDER — LORAZEPAM 1 MG PO TABS: 1.0000 mg | ORAL_TABLET | Freq: Three times a day (TID) | ORAL | Status: DC

## 2011-05-07 MED ORDER — LISINOPRIL 20 MG PO TABS: 20.0000 mg | ORAL_TABLET | Freq: Every day | ORAL | Status: DC

## 2011-05-07 MED ORDER — ESTROGENS, CONJUGATED 0.625 MG/GM VA CREA: 0.5000 g | TOPICAL_CREAM | VAGINAL | Status: AC

## 2011-05-07 MED ORDER — ATENOLOL 25 MG PO TABS: 25.0000 mg | ORAL_TABLET | ORAL | Status: DC

## 2011-05-07 MED ORDER — HYOSCYAMINE SULFATE 0.125 MG SL SUBL: 0.1250 mg | SUBLINGUAL_TABLET | SUBLINGUAL | Status: DC | PRN

## 2011-05-07 MED ORDER — AMLODIPINE BESYLATE 5 MG PO TABS: 5.0000 mg | ORAL_TABLET | Freq: Every day | ORAL | Status: DC

## 2011-05-07 NOTE — Patient Instructions (Signed)
Continue your current medications.  Remember to walk 20 minutes daily.  Follow-up in one year or sooner if any problem

## 2011-05-07 NOTE — Progress Notes (Signed)
Addended by: Kern Reap B on: 05/07/2011 03:06 PM   Modules accepted: Orders

## 2011-05-07 NOTE — Progress Notes (Signed)
  Subjective:    Patient ID: Catherine Daniel, female    DOB: 12-05-1945, 65 y.o.   MRN: [id number]  HPI Catherine Daniel is a 65 year old, married female, nonsmoker, who comes in today for evaluation of hypertension, anxiety, hyperlipidemia.  She is currently taking Norvasc 5 mg daily, Tenormin, 25 mg daily, lisinopril, 20 mg daily, BP 110/80.  She also takes Lipitor 20 mg nightly for hyperlipidemia.  She takes Levsin one sublingual p.r.n. For IBS.  She takes Ativan 1 mg t.i.d. P.r.n. For anxiety.  She get her GI care, dental care, BSE monthly, and you mammography, colonoscopy, 2011, mammogram, May 2011, tetanus, 2009, information given on shingles, and she was given a Pneumovax today.  Actually, mammogram was just last week.  She does have some mild glucose intolerance.  Blood sugar 109   Review of Systems  Constitutional: Negative.   HENT: Negative.   Eyes: Negative.   Respiratory: Negative.   Cardiovascular: Negative.   Gastrointestinal: Negative.   Genitourinary: Negative.   Musculoskeletal: Negative.   Neurological: Negative.   Hematological: Negative.   Psychiatric/Behavioral: Negative.        Objective:   Physical Exam  Constitutional: She appears well-developed and well-nourished.  HENT:  Head: Normocephalic and atraumatic.  Right Ear: External ear normal.  Left Ear: External ear normal.  Nose: Nose normal.  Mouth/Throat: Oropharynx is clear and moist.  Eyes: EOM are normal. Pupils are equal, round, and reactive to light.  Neck: Normal range of motion. Neck supple. No thyromegaly present.  Cardiovascular: Normal rate, regular rhythm, normal heart sounds and intact distal pulses.  Exam reveals no gallop and no friction rub.   No murmur heard. Pulmonary/Chest: Effort normal and breath sounds normal.  Abdominal: Soft. Bowel sounds are normal. She exhibits no distension and no mass. There is no tenderness. There is no rebound.  Genitourinary: Vagina normal and uterus normal.  Guaiac negative stool. No vaginal discharge found.  Musculoskeletal: Normal range of motion.  Lymphadenopathy:    She has no cervical adenopathy.  Neurological: She is alert. She has normal reflexes. No cranial nerve deficit. She exhibits normal muscle tone. Coordination normal.  Skin: Skin is warm and dry.  Psychiatric: She has a normal mood and affect. Her behavior is normal. Judgment and thought content normal.          Assessment & Plan:  Hypertension continue medication.  Hyperlipidemia.  Continue medication.  History of IBS continue Levsin p.r.n.  Anxiety.  Continue Ativan p.r.n.  Follow-up in one year, sooner if any problems.  Continue Premarin vaginal cream twice weekly for vaginal dryness

## 2012-01-10 DIAGNOSIS — I10 Essential (primary) hypertension: Secondary | ICD-10-CM | POA: Diagnosis not present

## 2012-01-10 DIAGNOSIS — H2510 Age-related nuclear cataract, unspecified eye: Secondary | ICD-10-CM | POA: Diagnosis not present

## 2012-01-10 DIAGNOSIS — B300 Keratoconjunctivitis due to adenovirus: Secondary | ICD-10-CM | POA: Diagnosis not present

## 2012-04-28 ENCOUNTER — Other Ambulatory Visit: Payer: Self-pay | Admitting: Family Medicine

## 2012-05-03 ENCOUNTER — Other Ambulatory Visit: Payer: Self-pay | Admitting: Family Medicine

## 2012-05-08 ENCOUNTER — Encounter: Payer: Self-pay | Admitting: Family Medicine

## 2012-05-08 ENCOUNTER — Ambulatory Visit (INDEPENDENT_AMBULATORY_CARE_PROVIDER_SITE_OTHER): Payer: Medicare Other | Admitting: Family Medicine

## 2012-05-08 ENCOUNTER — Other Ambulatory Visit: Payer: Self-pay | Admitting: Family Medicine

## 2012-05-08 VITALS — BP 134/90 | Temp 97.8°F | Ht 64.0 in | Wt 210.0 lb

## 2012-05-08 DIAGNOSIS — Z0000 Encounter for general adult medical examination without abnormal findings: Secondary | ICD-10-CM | POA: Diagnosis not present

## 2012-05-08 DIAGNOSIS — E119 Type 2 diabetes mellitus without complications: Secondary | ICD-10-CM

## 2012-05-08 DIAGNOSIS — E785 Hyperlipidemia, unspecified: Secondary | ICD-10-CM | POA: Diagnosis not present

## 2012-05-08 DIAGNOSIS — I10 Essential (primary) hypertension: Secondary | ICD-10-CM

## 2012-05-08 DIAGNOSIS — Z1231 Encounter for screening mammogram for malignant neoplasm of breast: Secondary | ICD-10-CM

## 2012-05-08 DIAGNOSIS — K589 Irritable bowel syndrome without diarrhea: Secondary | ICD-10-CM

## 2012-05-08 LAB — CBC WITH DIFFERENTIAL/PLATELET
Eosinophils Relative: 2 % (ref 0.0–5.0)
HCT: 42.6 % (ref 36.0–46.0)
Hemoglobin: 13.8 g/dL (ref 12.0–15.0)
Lymphocytes Relative: 34.1 % (ref 12.0–46.0)
Lymphs Abs: 1.4 10*3/uL (ref 0.7–4.0)
Monocytes Relative: 7.6 % (ref 3.0–12.0)
Neutro Abs: 2.4 10*3/uL (ref 1.4–7.7)
RBC: 5.13 Mil/uL — ABNORMAL HIGH (ref 3.87–5.11)
WBC: 4.2 10*3/uL — ABNORMAL LOW (ref 4.5–10.5)

## 2012-05-08 LAB — LIPID PANEL
HDL: 42.7 mg/dL (ref >39.00)
LDL Cholesterol: 79 mg/dL (ref 0–99)
Total CHOL/HDL Ratio: 3
Triglycerides: 93 mg/dL (ref 0.0–149.0)
VLDL: 18.6 mg/dL (ref 0.0–40.0)

## 2012-05-08 LAB — POCT URINALYSIS DIPSTICK
Blood, UA: NEGATIVE
Glucose, UA: NEGATIVE
Leukocytes, UA: NEGATIVE
pH, UA: 6

## 2012-05-08 LAB — BASIC METABOLIC PANEL
BUN: 16 mg/dL (ref 6–23)
Chloride: 108 mEq/L (ref 96–112)
Potassium: 4.2 mEq/L (ref 3.5–5.1)
Sodium: 142 mEq/L (ref 135–145)

## 2012-05-08 LAB — HEPATIC FUNCTION PANEL: Albumin: 3.9 g/dL (ref 3.5–5.2)

## 2012-05-08 MED ORDER — HYOSCYAMINE SULFATE 0.125 MG SL SUBL: 0.1250 mg | SUBLINGUAL_TABLET | SUBLINGUAL | Status: DC | PRN

## 2012-05-08 MED ORDER — ATORVASTATIN CALCIUM 20 MG PO TABS: 20.0000 mg | ORAL_TABLET | Freq: Every day | ORAL | Status: DC

## 2012-05-08 MED ORDER — ATENOLOL 25 MG PO TABS: 25.0000 mg | ORAL_TABLET | ORAL | Status: DC

## 2012-05-08 MED ORDER — LORAZEPAM 1 MG PO TABS: ORAL_TABLET | ORAL | Status: DC

## 2012-05-08 MED ORDER — LISINOPRIL 20 MG PO TABS: 20.0000 mg | ORAL_TABLET | Freq: Every day | ORAL | Status: DC

## 2012-05-08 MED ORDER — AMLODIPINE BESYLATE 5 MG PO TABS: 5.0000 mg | ORAL_TABLET | Freq: Every day | ORAL | Status: DC

## 2012-05-08 NOTE — Progress Notes (Signed)
  Subjective:    Patient ID: Catherine Daniel, female    DOB: Mar 14, 1946, 66 y.o.   MRN: [id number]  HPI Catherine Daniel is a 66 year old married female nonsmoker who comes in today for a Medicare wellness examination  She takes Norvasc 5 mg daily, Tenormin 25 mg daily, and lisinopril 20 mg daily for hypertension BP 134/90  She takes 20 mg of Lipitor each bedtime for hyperlipidemia  She takes over-the-counter vitamins and minerals and vitamin D.  She gets routine eye care, dental care, BSE monthly, and you mammography, colonoscopy last year normal, tetanus 2009, Pneumovax 2012, information given on shingles  Her cognitive function normal she does not exercise on a regular basis. Home health safety reviewed no issues identified, they do have guns in the house because her husband was a policeman in the used to live in [place] they do have a health care power of attorney and a living well    Review of Systems  Constitutional: Negative.   HENT: Negative.   Eyes: Negative.   Respiratory: Negative.   Cardiovascular: Negative.   Gastrointestinal: Negative.   Genitourinary: Negative.   Musculoskeletal: Negative.   Neurological: Negative.   Hematological: Negative.   Psychiatric/Behavioral: Negative.        Objective:   Physical Exam  Constitutional: She appears well-developed and well-nourished.  HENT:  Head: Normocephalic and atraumatic.  Right Ear: External ear normal.  Left Ear: External ear normal.  Nose: Nose normal.  Mouth/Throat: Oropharynx is clear and moist.  Eyes: EOM are normal. Pupils are equal, round, and reactive to light.  Neck: Normal range of motion. Neck supple. No thyromegaly present.  Cardiovascular: Normal rate, regular rhythm, normal heart sounds and intact distal pulses.  Exam reveals no gallop and no friction rub.   No murmur heard. Pulmonary/Chest: Effort normal and breath sounds normal.  Abdominal: Soft. Bowel sounds are normal. She exhibits no distension  and no mass. There is no tenderness. There is no rebound.  Genitourinary:       Bilateral breast exam normal  Musculoskeletal: Normal range of motion.  Lymphadenopathy:    She has no cervical adenopathy.  Neurological: She is alert. She has normal reflexes. No cranial nerve deficit. She exhibits normal muscle tone. Coordination normal.  Skin: Skin is warm and dry.       Total body skin exam normal  Psychiatric: She has a normal mood and affect. Her behavior is normal. Judgment and thought content normal.          Assessment & Plan:  Healthy female  Hypertension continue current meds  Hyperlipidemia continue current meds check lipid panel  Obesity weight 210 pounds recommend diet exercise and weight loss

## 2012-05-08 NOTE — Patient Instructions (Signed)
Continue your current medications  I will call you with your lab reports and we will discuss any changes we need to make  Begin a walking program 20 minutes daily

## 2012-05-12 ENCOUNTER — Ambulatory Visit
Admission: RE | Admit: 2012-05-12 | Discharge: 2012-05-12 | Disposition: A | Discharge status: Home / Self Care | Payer: Medicare Other | Source: Ambulatory Visit | Attending: Family Medicine | Admitting: Family Medicine

## 2012-05-12 DIAGNOSIS — Z1231 Encounter for screening mammogram for malignant neoplasm of breast: Secondary | ICD-10-CM

## 2012-05-12 NOTE — Progress Notes (Signed)
Quick Note:  Spoke with pt- informed labs normal ______ 

## 2012-08-05 ENCOUNTER — Other Ambulatory Visit: Payer: Self-pay | Admitting: Family Medicine

## 2013-04-28 ENCOUNTER — Other Ambulatory Visit: Payer: Self-pay

## 2013-04-28 DIAGNOSIS — Z1231 Encounter for screening mammogram for malignant neoplasm of breast: Secondary | ICD-10-CM

## 2013-04-30 ENCOUNTER — Other Ambulatory Visit: Payer: Self-pay | Admitting: Family Medicine

## 2013-05-11 ENCOUNTER — Ambulatory Visit (INDEPENDENT_AMBULATORY_CARE_PROVIDER_SITE_OTHER): Payer: Medicare Other | Admitting: Family Medicine

## 2013-05-11 ENCOUNTER — Encounter: Payer: Self-pay | Admitting: Family Medicine

## 2013-05-11 VITALS — BP 124/80 | Temp 97.7°F | Ht 64.0 in | Wt 216.0 lb

## 2013-05-11 DIAGNOSIS — K589 Irritable bowel syndrome without diarrhea: Secondary | ICD-10-CM | POA: Insufficient documentation

## 2013-05-11 DIAGNOSIS — R7309 Other abnormal glucose: Secondary | ICD-10-CM

## 2013-05-11 DIAGNOSIS — R739 Hyperglycemia, unspecified: Secondary | ICD-10-CM

## 2013-05-11 DIAGNOSIS — I10 Essential (primary) hypertension: Secondary | ICD-10-CM

## 2013-05-11 DIAGNOSIS — E785 Hyperlipidemia, unspecified: Secondary | ICD-10-CM

## 2013-05-11 DIAGNOSIS — Z0000 Encounter for general adult medical examination without abnormal findings: Secondary | ICD-10-CM | POA: Diagnosis not present

## 2013-05-11 DIAGNOSIS — F4323 Adjustment disorder with mixed anxiety and depressed mood: Secondary | ICD-10-CM | POA: Insufficient documentation

## 2013-05-11 LAB — CBC WITH DIFFERENTIAL/PLATELET
Basophils Absolute: 0 10*3/uL (ref 0.0–0.1)
Eosinophils Absolute: 0.1 10*3/uL (ref 0.0–0.7)
HCT: 44.8 % (ref 36.0–46.0)
Hemoglobin: 14.6 g/dL (ref 12.0–15.0)
Lymphs Abs: 2 10*3/uL (ref 0.7–4.0)
MCHC: 32.7 g/dL (ref 30.0–36.0)
MCV: 83.2 fl (ref 78.0–100.0)
Monocytes Absolute: 0.5 10*3/uL (ref 0.1–1.0)
Neutro Abs: 7.5 10*3/uL (ref 1.4–7.7)
RDW: 13.5 % (ref 11.5–14.6)

## 2013-05-11 LAB — POCT URINALYSIS DIPSTICK
Blood, UA: NEGATIVE
Nitrite, UA: NEGATIVE
Urobilinogen, UA: 0.2
pH, UA: 5.5

## 2013-05-11 LAB — BASIC METABOLIC PANEL
BUN: 16 mg/dL (ref 6–23)
CO2: 23 mEq/L (ref 19–32)
Calcium: 9.3 mg/dL (ref 8.4–10.5)
GFR: 80.43 mL/min (ref >60.00)
Glucose, Bld: 111 mg/dL — ABNORMAL HIGH (ref 70–99)

## 2013-05-11 LAB — LIPID PANEL
Cholesterol: 144 mg/dL (ref 0–200)
LDL Cholesterol: 85 mg/dL (ref 0–99)
Total CHOL/HDL Ratio: 3

## 2013-05-11 LAB — HEPATIC FUNCTION PANEL
Alkaline Phosphatase: 60 U/L (ref 39–117)
Bilirubin, Direct: 0.1 mg/dL (ref 0.0–0.3)
Total Protein: 7 g/dL (ref 6.0–8.3)

## 2013-05-11 MED ORDER — ATENOLOL 25 MG PO TABS: 25.0000 mg | ORAL_TABLET | ORAL | Status: DC

## 2013-05-11 MED ORDER — HYOSCYAMINE SULFATE 0.125 MG SL SUBL: 0.1250 mg | SUBLINGUAL_TABLET | SUBLINGUAL | Status: DC | PRN

## 2013-05-11 MED ORDER — LORAZEPAM 1 MG PO TABS: ORAL_TABLET | ORAL | Status: DC

## 2013-05-11 MED ORDER — AMLODIPINE BESYLATE 5 MG PO TABS: ORAL_TABLET | ORAL | Status: DC

## 2013-05-11 MED ORDER — ATORVASTATIN CALCIUM 20 MG PO TABS: ORAL_TABLET | ORAL | Status: DC

## 2013-05-11 MED ORDER — LISINOPRIL 20 MG PO TABS: 20.0000 mg | ORAL_TABLET | Freq: Every day | ORAL | Status: DC

## 2013-05-11 NOTE — Progress Notes (Signed)
  Subjective:    Patient ID: Catherine Daniel, female    DOB: October 14, 1946, 67 y.o.   MRN: [id number]  HPI Catherine Daniel is a 67 year old married female nonsmoker who comes in today for a Medicare wellness examination because of history of hypertension, hyperlipidemia, interval bowel syndrome, and mild anxiety  Her medications reviewed the been no changes.  She gets routine eye care, dental care, BSE monthly, and you mammography, colonoscopy up-to-date, tetanus 2009, Pneumovax 2012, information given on shingles  Cognitive function normal she does not exercise on a regular basis home health safety reviewed no issues identified, no guns in the house, she does have a health care power of attorney and living well   Review of Systems  Constitutional: Negative.   HENT: Negative.   Eyes: Negative.   Respiratory: Negative.   Cardiovascular: Negative.   Gastrointestinal: Negative.   Genitourinary: Negative.   Musculoskeletal: Negative.   Neurological: Negative.   Psychiatric/Behavioral: Negative.        Objective:   Physical Exam  Constitutional: She appears well-developed and well-nourished.  HENT:  Head: Normocephalic and atraumatic.  Right Ear: External ear normal.  Left Ear: External ear normal.  Nose: Nose normal.  Mouth/Throat: Oropharynx is clear and moist.  Eyes: EOM are normal. Pupils are equal, round, and reactive to light.  Neck: Normal range of motion. Neck supple. No thyromegaly present.  Cardiovascular: Normal rate, regular rhythm, normal heart sounds and intact distal pulses.  Exam reveals no gallop and no friction rub.   No murmur heard. Pulmonary/Chest: Effort normal and breath sounds normal.  Abdominal: Soft. Bowel sounds are normal. She exhibits no distension and no mass. There is no tenderness. There is no rebound.  Genitourinary:  Bilateral breast exam normal  Pelvic every 3 years  Musculoskeletal: Normal range of motion.  Lymphadenopathy:    She has no cervical  adenopathy.  Neurological: She is alert. She has normal reflexes. No cranial nerve deficit. She exhibits normal muscle tone. Coordination normal.  Skin: Skin is warm and dry.  Psychiatric: She has a normal mood and affect. Her behavior is normal. Judgment and thought content normal.          Assessment & Plan:  Healthy female  Hypertension continue Norvasc Tenormin and lisinopril  Hyperlipidemia continue Lipitor 20 mg daily along with a baby aspirin  Interwell syndrome Levsin when necessary  Occasional anxiety and sleep dysfunction Ativan 1 mg each bedtime when necessary

## 2013-05-11 NOTE — Patient Instructions (Signed)
Continue your current medications  Walk 30 minutes daily  Return in one year for general physical examination sooner if any problems

## 2013-05-19 ENCOUNTER — Ambulatory Visit: Payer: Medicare Other

## 2013-05-19 ENCOUNTER — Ambulatory Visit (INDEPENDENT_AMBULATORY_CARE_PROVIDER_SITE_OTHER): Payer: Medicare Other

## 2013-05-19 DIAGNOSIS — Z1231 Encounter for screening mammogram for malignant neoplasm of breast: Secondary | ICD-10-CM | POA: Diagnosis not present

## 2013-05-26 ENCOUNTER — Ambulatory Visit: Payer: Medicare Other

## 2013-06-01 ENCOUNTER — Other Ambulatory Visit: Payer: Self-pay | Admitting: Family Medicine

## 2013-06-29 ENCOUNTER — Other Ambulatory Visit: Payer: Self-pay | Admitting: Family Medicine

## 2014-01-05 ENCOUNTER — Telehealth: Payer: Self-pay | Admitting: Family Medicine

## 2014-01-05 DIAGNOSIS — I10 Essential (primary) hypertension: Secondary | ICD-10-CM

## 2014-01-05 DIAGNOSIS — E785 Hyperlipidemia, unspecified: Secondary | ICD-10-CM

## 2014-01-05 NOTE — Telephone Encounter (Signed)
Labs ordered okay to schedule

## 2014-01-05 NOTE — Telephone Encounter (Signed)
Done

## 2014-01-05 NOTE — Telephone Encounter (Signed)
Pt states dr todd told her to have labs done a week prior to her and her husband's cpe appt this year. OK to schedule?

## 2014-05-05 ENCOUNTER — Other Ambulatory Visit: Payer: Medicare Other

## 2014-05-06 ENCOUNTER — Other Ambulatory Visit (INDEPENDENT_AMBULATORY_CARE_PROVIDER_SITE_OTHER): Payer: Medicare Other

## 2014-05-06 DIAGNOSIS — E785 Hyperlipidemia, unspecified: Secondary | ICD-10-CM | POA: Diagnosis not present

## 2014-05-06 DIAGNOSIS — I10 Essential (primary) hypertension: Secondary | ICD-10-CM | POA: Diagnosis not present

## 2014-05-06 LAB — CBC WITH DIFFERENTIAL/PLATELET
BASOS ABS: 0 10*3/uL (ref 0.0–0.1)
Basophils Relative: 0.6 % (ref 0.0–3.0)
EOS ABS: 0.1 10*3/uL (ref 0.0–0.7)
Eosinophils Relative: 2.6 % (ref 0.0–5.0)
HCT: 44.4 % (ref 36.0–46.0)
Hemoglobin: 14.3 g/dL (ref 12.0–15.0)
LYMPHS PCT: 34.4 % (ref 12.0–46.0)
Lymphs Abs: 1.8 10*3/uL (ref 0.7–4.0)
MCHC: 32.1 g/dL (ref 30.0–36.0)
MCV: 82.1 fl (ref 78.0–100.0)
MONO ABS: 0.4 10*3/uL (ref 0.1–1.0)
Monocytes Relative: 7.8 % (ref 3.0–12.0)
NEUTROS PCT: 54.6 % (ref 43.0–77.0)
Neutro Abs: 2.9 10*3/uL (ref 1.4–7.7)
PLATELETS: 158 10*3/uL (ref 150.0–400.0)
RBC: 5.41 Mil/uL — ABNORMAL HIGH (ref 3.87–5.11)
RDW: 14.4 % (ref 11.5–15.5)
WBC: 5.3 10*3/uL (ref 4.0–10.5)

## 2014-05-06 LAB — LIPID PANEL
CHOL/HDL RATIO: 3
CHOLESTEROL: 152 mg/dL (ref 0–200)
HDL: 44.9 mg/dL (ref >39.00)
LDL Cholesterol: 88 mg/dL (ref 0–99)
NonHDL: 107.1
TRIGLYCERIDES: 95 mg/dL (ref 0.0–149.0)
VLDL: 19 mg/dL (ref 0.0–40.0)

## 2014-05-06 LAB — HEPATIC FUNCTION PANEL
ALK PHOS: 61 U/L (ref 39–117)
ALT: 22 U/L (ref 0–35)
AST: 22 U/L (ref 0–37)
Albumin: 4 g/dL (ref 3.5–5.2)
BILIRUBIN DIRECT: 0.1 mg/dL (ref 0.0–0.3)
TOTAL PROTEIN: 6.9 g/dL (ref 6.0–8.3)
Total Bilirubin: 0.7 mg/dL (ref 0.2–1.2)

## 2014-05-06 LAB — BASIC METABOLIC PANEL
BUN: 13 mg/dL (ref 6–23)
CALCIUM: 9.6 mg/dL (ref 8.4–10.5)
CO2: 28 meq/L (ref 19–32)
CREATININE: 0.7 mg/dL (ref 0.4–1.2)
Chloride: 105 mEq/L (ref 96–112)
GFR: 92.75 mL/min (ref >60.00)
Glucose, Bld: 119 mg/dL — ABNORMAL HIGH (ref 70–99)
Potassium: 4.3 mEq/L (ref 3.5–5.1)
Sodium: 139 mEq/L (ref 135–145)

## 2014-05-06 LAB — TSH: TSH: 2.25 u[IU]/mL (ref 0.35–4.50)

## 2014-05-11 ENCOUNTER — Other Ambulatory Visit: Payer: Self-pay | Admitting: Family Medicine

## 2014-05-11 DIAGNOSIS — Z1231 Encounter for screening mammogram for malignant neoplasm of breast: Secondary | ICD-10-CM

## 2014-05-13 ENCOUNTER — Ambulatory Visit (INDEPENDENT_AMBULATORY_CARE_PROVIDER_SITE_OTHER): Payer: Medicare Other | Admitting: Family Medicine

## 2014-05-13 ENCOUNTER — Encounter: Payer: Self-pay | Admitting: Family Medicine

## 2014-05-13 DIAGNOSIS — E119 Type 2 diabetes mellitus without complications: Secondary | ICD-10-CM | POA: Diagnosis not present

## 2014-05-13 DIAGNOSIS — M25562 Pain in left knee: Secondary | ICD-10-CM

## 2014-05-13 DIAGNOSIS — Z23 Encounter for immunization: Secondary | ICD-10-CM | POA: Diagnosis not present

## 2014-05-13 DIAGNOSIS — M25569 Pain in unspecified knee: Secondary | ICD-10-CM

## 2014-05-13 DIAGNOSIS — E785 Hyperlipidemia, unspecified: Secondary | ICD-10-CM

## 2014-05-13 DIAGNOSIS — M25561 Pain in right knee: Secondary | ICD-10-CM

## 2014-05-13 DIAGNOSIS — I10 Essential (primary) hypertension: Secondary | ICD-10-CM

## 2014-05-13 MED ORDER — ATORVASTATIN CALCIUM 20 MG PO TABS: ORAL_TABLET | ORAL | Status: DC

## 2014-05-13 MED ORDER — ATENOLOL 25 MG PO TABS: 25.0000 mg | ORAL_TABLET | ORAL | Status: DC

## 2014-05-13 MED ORDER — LISINOPRIL 20 MG PO TABS: 20.0000 mg | ORAL_TABLET | Freq: Every day | ORAL | Status: DC

## 2014-05-13 MED ORDER — AMLODIPINE BESYLATE 5 MG PO TABS: ORAL_TABLET | ORAL | Status: DC

## 2014-05-13 NOTE — Patient Instructions (Signed)
Continue your current medications  Motrin 400 mg twice daily with food for bilateral knee pain  And evening elevation and ice for your knees  Call Dr. Joni Fears orthopedist for consult and evaluation  Return in one year for general checkup sooner if any problem

## 2014-05-13 NOTE — Progress Notes (Signed)
   Subjective:    Patient ID: Catherine Daniel, female    DOB: Sep 01, 1946, 68 y.o.   MRN: [id number]  HPI Catherine Daniel is a 68 year old married female nonsmoker who comes in today for evaluation of hypertension, and a new problem of bilateral knee pain  She takes Norvasc 5 mg Tenormin 25 mg lisinopril 20 mg in the morning for hypertension BP 110/88  She takes Lipitor 20 mg each bedtime at bedtime for hyperlipidemia.  She's not been able to exercise because of pain in her knees. She says it's been bothering her for couple months has gotten worse. Her weight is 204 pounds. She denies any history of,.  She gets routine eye care, dental care, BSE monthly, mammogram scheduled for the end of this month, colonoscopy 2013 normal  Vaccinations updated by Catherine Daniel   Review of Systems  Constitutional: Negative.   HENT: Negative.   Eyes: Negative.   Respiratory: Negative.   Cardiovascular: Negative.   Gastrointestinal: Negative.   Genitourinary: Negative.   Musculoskeletal: Negative.   Neurological: Negative.   Psychiatric/Behavioral: Negative.        Objective:   Physical Exam  Nursing note and vitals reviewed. Constitutional: She appears well-developed and well-nourished.  HENT:  Head: Normocephalic and atraumatic.  Right Ear: External ear normal.  Left Ear: External ear normal.  Nose: Nose normal.  Mouth/Throat: Oropharynx is clear and moist.  Eyes: EOM are normal. Pupils are equal, round, and reactive to light.  Neck: Normal range of motion. Neck supple. No thyromegaly present.  Cardiovascular: Normal rate, regular rhythm, normal heart sounds and intact distal pulses.  Exam reveals no gallop and no friction rub.   No murmur heard. Pulmonary/Chest: Effort normal and breath sounds normal.  Abdominal: Soft. Bowel sounds are normal. She exhibits no distension and no mass. There is no tenderness. There is no rebound.  Genitourinary:  Bilateral breast exam normal  GYN wise asymptomatic  therefore pelvic deferred  Musculoskeletal: Normal range of motion.  Lymphadenopathy:    She has no cervical adenopathy.  Neurological: She is alert. She has normal reflexes. No cranial nerve deficit. She exhibits normal muscle tone. Coordination normal.  Skin: Skin is warm and dry.  Total body skin exam normal except for a combination of freckles moles and a lot of seborrheic  Psychiatric: She has a normal mood and affect. Her behavior is normal. Judgment and thought content normal.   both knees appear swollen and somewhat deformed. Ligaments intact.        Assessment & Plan:  Healthy female  Hypertension at goal continue current therapy  Hyperlipidemia continue Lipitor and aspirin  Bilateral knee pain consult with Dr. Durward Fortes  Overweight again discussed diet exercise and weight loss,,,,,,,,,,,,,,,, however she's not been able to exercise because of knee pain.

## 2014-05-13 NOTE — Progress Notes (Signed)
Pre visit review using our clinic review tool, if applicable. No additional management support is needed unless otherwise documented below in the visit note. 

## 2014-05-14 ENCOUNTER — Telehealth: Payer: Self-pay | Admitting: Family Medicine

## 2014-05-14 NOTE — Telephone Encounter (Signed)
Relevant patient education mailed to patient.  

## 2014-05-20 ENCOUNTER — Ambulatory Visit (INDEPENDENT_AMBULATORY_CARE_PROVIDER_SITE_OTHER): Payer: Medicare Other

## 2014-05-20 DIAGNOSIS — Z1231 Encounter for screening mammogram for malignant neoplasm of breast: Secondary | ICD-10-CM | POA: Diagnosis not present

## 2014-07-15 DIAGNOSIS — M1710 Unilateral primary osteoarthritis, unspecified knee: Secondary | ICD-10-CM | POA: Diagnosis not present

## 2014-10-20 DIAGNOSIS — H43812 Vitreous degeneration, left eye: Secondary | ICD-10-CM | POA: Diagnosis not present

## 2014-11-15 DIAGNOSIS — H43812 Vitreous degeneration, left eye: Secondary | ICD-10-CM | POA: Diagnosis not present

## 2014-12-30 DIAGNOSIS — H43812 Vitreous degeneration, left eye: Secondary | ICD-10-CM | POA: Diagnosis not present

## 2015-04-11 ENCOUNTER — Encounter: Payer: Self-pay | Admitting: Internal Medicine

## 2015-05-03 ENCOUNTER — Other Ambulatory Visit: Payer: Self-pay | Admitting: Family Medicine

## 2015-05-10 ENCOUNTER — Other Ambulatory Visit: Payer: Self-pay | Admitting: Family Medicine

## 2015-05-10 DIAGNOSIS — Z1231 Encounter for screening mammogram for malignant neoplasm of breast: Secondary | ICD-10-CM

## 2015-05-23 ENCOUNTER — Other Ambulatory Visit: Payer: Self-pay | Admitting: Family Medicine

## 2015-05-25 ENCOUNTER — Ambulatory Visit (INDEPENDENT_AMBULATORY_CARE_PROVIDER_SITE_OTHER): Payer: Medicare Other

## 2015-05-25 DIAGNOSIS — Z1231 Encounter for screening mammogram for malignant neoplasm of breast: Secondary | ICD-10-CM

## 2015-05-26 ENCOUNTER — Other Ambulatory Visit: Payer: Self-pay | Admitting: *Deleted

## 2015-05-26 DIAGNOSIS — I10 Essential (primary) hypertension: Secondary | ICD-10-CM

## 2015-05-26 DIAGNOSIS — E785 Hyperlipidemia, unspecified: Secondary | ICD-10-CM

## 2015-05-26 MED ORDER — AMLODIPINE BESYLATE 5 MG PO TABS: 5.0000 mg | ORAL_TABLET | Freq: Every day | ORAL | Status: DC

## 2015-05-26 MED ORDER — ATENOLOL 25 MG PO TABS: 25.0000 mg | ORAL_TABLET | ORAL | Status: DC

## 2015-05-26 MED ORDER — ATORVASTATIN CALCIUM 20 MG PO TABS: ORAL_TABLET | ORAL | Status: DC

## 2015-06-21 ENCOUNTER — Encounter: Payer: Self-pay | Admitting: Gastroenterology

## 2015-07-18 ENCOUNTER — Other Ambulatory Visit: Payer: Self-pay | Admitting: Family Medicine

## 2016-01-09 ENCOUNTER — Telehealth: Payer: Self-pay | Admitting: Family Medicine

## 2016-01-09 DIAGNOSIS — E785 Hyperlipidemia, unspecified: Secondary | ICD-10-CM

## 2016-01-09 DIAGNOSIS — I10 Essential (primary) hypertension: Secondary | ICD-10-CM

## 2016-01-09 NOTE — Telephone Encounter (Signed)
Pt is schedule for cpx on 01-17-16 at 3 pm and would like cpx labs in advance. Can I sch? Pt is on medicare

## 2016-01-09 NOTE — Telephone Encounter (Signed)
Labs ordered.  Okay to schedule

## 2016-01-09 NOTE — Telephone Encounter (Signed)
Pt has been sch

## 2016-01-11 ENCOUNTER — Other Ambulatory Visit (INDEPENDENT_AMBULATORY_CARE_PROVIDER_SITE_OTHER): Payer: Medicare Other

## 2016-01-11 DIAGNOSIS — E785 Hyperlipidemia, unspecified: Secondary | ICD-10-CM

## 2016-01-11 DIAGNOSIS — I10 Essential (primary) hypertension: Secondary | ICD-10-CM | POA: Diagnosis not present

## 2016-01-11 LAB — HEPATIC FUNCTION PANEL
ALT: 17 U/L (ref 0–35)
AST: 16 U/L (ref 0–37)
Albumin: 4.1 g/dL (ref 3.5–5.2)
Alkaline Phosphatase: 68 U/L (ref 39–117)
BILIRUBIN DIRECT: 0.2 mg/dL (ref 0.0–0.3)
BILIRUBIN TOTAL: 0.8 mg/dL (ref 0.2–1.2)
TOTAL PROTEIN: 6.9 g/dL (ref 6.0–8.3)

## 2016-01-11 LAB — LIPID PANEL
CHOLESTEROL: 150 mg/dL (ref 0–200)
HDL: 42 mg/dL (ref >39.00)
LDL Cholesterol: 86 mg/dL (ref 0–99)
NonHDL: 107.69
TRIGLYCERIDES: 110 mg/dL (ref 0.0–149.0)
Total CHOL/HDL Ratio: 4
VLDL: 22 mg/dL (ref 0.0–40.0)

## 2016-01-11 LAB — CBC WITH DIFFERENTIAL/PLATELET
BASOS PCT: 0.4 % (ref 0.0–3.0)
Basophils Absolute: 0 10*3/uL (ref 0.0–0.1)
EOS PCT: 1.7 % (ref 0.0–5.0)
Eosinophils Absolute: 0.1 10*3/uL (ref 0.0–0.7)
HCT: 44.7 % (ref 36.0–46.0)
Hemoglobin: 14.5 g/dL (ref 12.0–15.0)
LYMPHS ABS: 1.9 10*3/uL (ref 0.7–4.0)
Lymphocytes Relative: 28.3 % (ref 12.0–46.0)
MCHC: 32.4 g/dL (ref 30.0–36.0)
MCV: 81.1 fl (ref 78.0–100.0)
MONO ABS: 0.6 10*3/uL (ref 0.1–1.0)
Monocytes Relative: 9.1 % (ref 3.0–12.0)
NEUTROS PCT: 60.5 % (ref 43.0–77.0)
Neutro Abs: 4 10*3/uL (ref 1.4–7.7)
PLATELETS: 136 10*3/uL — AB (ref 150.0–400.0)
RBC: 5.51 Mil/uL — ABNORMAL HIGH (ref 3.87–5.11)
RDW: 14.6 % (ref 11.5–15.5)
WBC: 6.6 10*3/uL (ref 4.0–10.5)

## 2016-01-11 LAB — BASIC METABOLIC PANEL
BUN: 17 mg/dL (ref 6–23)
CO2: 25 mEq/L (ref 19–32)
Calcium: 9.5 mg/dL (ref 8.4–10.5)
Chloride: 105 mEq/L (ref 96–112)
Creatinine, Ser: 0.64 mg/dL (ref 0.40–1.20)
GFR: 97.31 mL/min (ref >60.00)
Glucose, Bld: 120 mg/dL — ABNORMAL HIGH (ref 70–99)
POTASSIUM: 3.9 meq/L (ref 3.5–5.1)
Sodium: 140 mEq/L (ref 135–145)

## 2016-01-11 LAB — TSH: TSH: 1.61 u[IU]/mL (ref 0.35–4.50)

## 2016-01-17 ENCOUNTER — Ambulatory Visit (INDEPENDENT_AMBULATORY_CARE_PROVIDER_SITE_OTHER): Payer: Medicare Other | Admitting: Family Medicine

## 2016-01-17 ENCOUNTER — Other Ambulatory Visit (HOSPITAL_COMMUNITY)
Admission: RE | Admit: 2016-01-17 | Discharge: 2016-01-17 | Disposition: A | Discharge status: Home / Self Care | Payer: Medicare Other | Source: Ambulatory Visit | Attending: Family Medicine | Admitting: Family Medicine

## 2016-01-17 ENCOUNTER — Encounter: Payer: Self-pay | Admitting: Family Medicine

## 2016-01-17 VITALS — BP 124/80 | Temp 97.3°F | Ht 63.0 in | Wt 201.0 lb

## 2016-01-17 DIAGNOSIS — R8290 Unspecified abnormal findings in urine: Secondary | ICD-10-CM

## 2016-01-17 DIAGNOSIS — I10 Essential (primary) hypertension: Secondary | ICD-10-CM | POA: Diagnosis not present

## 2016-01-17 DIAGNOSIS — Z124 Encounter for screening for malignant neoplasm of cervix: Secondary | ICD-10-CM | POA: Insufficient documentation

## 2016-01-17 DIAGNOSIS — Z0000 Encounter for general adult medical examination without abnormal findings: Secondary | ICD-10-CM | POA: Insufficient documentation

## 2016-01-17 DIAGNOSIS — R7309 Other abnormal glucose: Secondary | ICD-10-CM | POA: Diagnosis not present

## 2016-01-17 DIAGNOSIS — R739 Hyperglycemia, unspecified: Secondary | ICD-10-CM

## 2016-01-17 DIAGNOSIS — E785 Hyperlipidemia, unspecified: Secondary | ICD-10-CM

## 2016-01-17 LAB — POC URINALSYSI DIPSTICK (AUTOMATED)
Bilirubin, UA: NEGATIVE
Blood, UA: NEGATIVE
Glucose, UA: NEGATIVE
KETONES UA: NEGATIVE
Nitrite, UA: NEGATIVE
PH UA: 5.5
PROTEIN UA: NEGATIVE
SPEC GRAV UA: 1.025
Urobilinogen, UA: 0.2

## 2016-01-17 LAB — HEMOGLOBIN A1C: Hgb A1c MFr Bld: 6.4 % (ref 4.6–6.5)

## 2016-01-17 MED ORDER — AMLODIPINE BESYLATE 5 MG PO TABS: 5.0000 mg | ORAL_TABLET | Freq: Every day | ORAL | Status: DC

## 2016-01-17 MED ORDER — LISINOPRIL 20 MG PO TABS: ORAL_TABLET | ORAL | Status: DC

## 2016-01-17 MED ORDER — ATENOLOL 25 MG PO TABS: 25.0000 mg | ORAL_TABLET | ORAL | Status: DC

## 2016-01-17 NOTE — Progress Notes (Signed)
Subjective:    Patient ID: Catherine Daniel, female    DOB: 1946-01-12, 70 y.o.   MRN: [id number]  HPI Shateara is a 70 year old married female nonsmoker who comes in today for general physical examination because of a history of the metabolic syndrome. She has underlying hypertension on Norvasc 5 mg daily, Tenormin 25 mg daily, lisinopril 20 mg daily BP 124/80  She takes Lipitor and a baby aspirin daily because of a history of hyperlipidemia  Her weight has dropped down 15 pounds in the past year via diet and no exercise. She cut out all the sugar. Blood sugar 120. We'll check an A1c. Positive family history of diabetes  She gets routine eye care, dental care, colonoscopy 5 years ago showed a polyp she is due for follow-up. Vaccinations she declines a flu shot. Tetanus 2009. Also recommended that she get a shingles vaccine.  Last pelvic and Pap 2011. GYN wise she is asymptomatic  Cognitive function normal she does not exercise home health safety reviewed no issues identified, they do have guns in the house Cozaar husbands a former Engineer, structural. They are locked up. She does not have a healthcare power of attorney nor living well. Advised to do so   Review of Systems  Constitutional: Negative.   HENT: Negative.   Eyes: Negative.   Respiratory: Negative.   Cardiovascular: Negative.   Gastrointestinal: Negative.   Endocrine: Negative.   Genitourinary: Negative.   Musculoskeletal: Negative.   Skin: Negative.   Allergic/Immunologic: Negative.   Neurological: Negative.   Hematological: Negative.   Psychiatric/Behavioral: Negative.        Objective:   Physical Exam  Constitutional: She appears well-developed and well-nourished.  HENT:  Head: Normocephalic and atraumatic.  Right Ear: External ear normal.  Left Ear: External ear normal.  Nose: Nose normal.  Mouth/Throat: Oropharynx is clear and moist.  Eyes: EOM are normal. Pupils are equal, round, and reactive to light.  Neck:  Normal range of motion. Neck supple. No JVD present. No tracheal deviation present. No thyromegaly present.  Cardiovascular: Normal rate, regular rhythm, normal heart sounds and intact distal pulses.  Exam reveals no gallop and no friction rub.   No murmur heard. No carotid nor aortic bruits peripheral pulses 2+ and symmetrical  Pulmonary/Chest: Effort normal and breath sounds normal. No stridor. No respiratory distress. She has no wheezes. She has no rales. She exhibits no tenderness.  Abdominal: Soft. Bowel sounds are normal. She exhibits no distension and no mass. There is no tenderness. There is no rebound and no guarding.  Genitourinary: Vagina normal and uterus normal. Guaiac negative stool. No vaginal discharge found.  Pelvic and packed normal except for atrophic vaginitis. Patient declines treatment  Bilateral breast exam normal  Musculoskeletal: Normal range of motion.  Lymphadenopathy:    She has no cervical adenopathy.  Neurological: She is alert. She has normal reflexes. No cranial nerve deficit. She exhibits normal muscle tone. Coordination normal.  Skin: Skin is warm and dry. No rash noted. No erythema. No pallor.  Total body skin exam normal except for numerous seborrheic keratosis  Psychiatric: She has a normal mood and affect. Her behavior is normal. Judgment and thought content normal.  Nursing note and vitals reviewed.         Assessment & Plan:  Obesity......Marland Kitchen weight down 15 pounds via diet......Marland Kitchen recommend she had a 30 minute daily walking program  Hypertension at goal......... continue current therapy  Hyperlipidemia........ continue current therapy  Elevated blood sugar.......... check A1c

## 2016-01-17 NOTE — Progress Notes (Signed)
Pre visit review using our clinic review tool, if applicable. No additional management support is needed unless otherwise documented below in the visit note. 

## 2016-01-17 NOTE — Patient Instructions (Signed)
Continue good diet he lost 15 pounds........... at 30 minutes of walking daily  Follow-up in one year sooner if any problems........ Catherine Daniel or Catherine Daniel are 2 new adult nurse practitioner's or Dr. [place].......... call in September for your physical exam in January  Check with your insurance company to see we can get the shingles vaccine the cheapest. If it here call and leave a voicemail with Apolonio Schneiders and she'll ordered for you  Remember to call GI to get set up for your follow-up colonoscopy

## 2016-01-19 LAB — CYTOLOGY - PAP

## 2016-03-07 ENCOUNTER — Other Ambulatory Visit: Payer: Self-pay | Admitting: Family Medicine

## 2016-05-15 ENCOUNTER — Other Ambulatory Visit: Payer: Self-pay | Admitting: Family Medicine

## 2016-05-15 DIAGNOSIS — Z1239 Encounter for other screening for malignant neoplasm of breast: Secondary | ICD-10-CM

## 2016-05-15 DIAGNOSIS — Z139 Encounter for screening, unspecified: Secondary | ICD-10-CM

## 2016-05-25 ENCOUNTER — Ambulatory Visit (INDEPENDENT_AMBULATORY_CARE_PROVIDER_SITE_OTHER): Payer: Medicare Other

## 2016-05-25 DIAGNOSIS — Z1231 Encounter for screening mammogram for malignant neoplasm of breast: Secondary | ICD-10-CM

## 2016-05-25 DIAGNOSIS — Z1239 Encounter for other screening for malignant neoplasm of breast: Secondary | ICD-10-CM

## 2016-07-04 ENCOUNTER — Encounter: Payer: Self-pay | Admitting: Family Medicine

## 2016-07-04 ENCOUNTER — Other Ambulatory Visit: Payer: Self-pay | Admitting: Family Medicine

## 2016-07-04 ENCOUNTER — Ambulatory Visit (INDEPENDENT_AMBULATORY_CARE_PROVIDER_SITE_OTHER): Payer: Medicare Other | Admitting: Family Medicine

## 2016-07-04 ENCOUNTER — Telehealth: Payer: Self-pay | Admitting: Internal Medicine

## 2016-07-04 VITALS — BP 124/77 | HR 40 | Temp 97.6°F | Resp 16 | Ht 63.25 in | Wt 206.8 lb

## 2016-07-04 DIAGNOSIS — I10 Essential (primary) hypertension: Secondary | ICD-10-CM

## 2016-07-04 DIAGNOSIS — E119 Type 2 diabetes mellitus without complications: Secondary | ICD-10-CM

## 2016-07-04 DIAGNOSIS — Z86010 Personal history of colonic polyps: Secondary | ICD-10-CM

## 2016-07-04 LAB — BASIC METABOLIC PANEL
BUN: 13 mg/dL (ref 6–23)
CALCIUM: 9.8 mg/dL (ref 8.4–10.5)
CHLORIDE: 102 meq/L (ref 96–112)
CO2: 26 meq/L (ref 19–32)
CREATININE: 0.66 mg/dL (ref 0.40–1.20)
GFR: 93.78 mL/min (ref >60.00)
Glucose, Bld: 103 mg/dL — ABNORMAL HIGH (ref 70–99)
Potassium: 5 mEq/L (ref 3.5–5.1)
Sodium: 136 mEq/L (ref 135–145)

## 2016-07-04 LAB — HEMOGLOBIN A1C: Hgb A1c MFr Bld: 6.6 % — ABNORMAL HIGH (ref 4.6–6.5)

## 2016-07-04 NOTE — Progress Notes (Signed)
Pre visit review using our clinic review tool, if applicable. No additional management support is needed unless otherwise documented below in the visit note. 

## 2016-07-04 NOTE — Progress Notes (Signed)
OFFICE VISIT  07/04/2016   CC:  Chief Complaint  Patient presents with  . Establish Care    transfer from Dr. Sherren Mocha     HPI:    Patient is a 70 y.o. Caucasian female who presents to establish/transfer care.  Was seeing Dr. Sherren Mocha at [facility].  No home glucose monitoring. Home bp monitoring: 120s/70s.    Takes lipitor daily and no side effects.  ROS: chronic fatigue  Past Medical History:  Diagnosis Date  . Diabetes mellitus type II    Highest A1c 6.6% in 2014.  Diet-controlled.  . Diverticulosis   . History of adenomatous polyp of colon 05/2010   Tubular adenoma.  Recall 5 yrs.  . History of cardiac catheterization 1999   normal  . Hyperlipidemia   . Hypertension   . Situational anxiety    Flying, heights, dentists: xanax prn works well    Past Surgical History:  Procedure Laterality Date  . CHOLECYSTECTOMY     for cholelithiasis  . COLONOSCOPY  05/31/2010   Descending colon and sigmoid diverticulosis-severe.  Polypectomy x 1.  Recall 5 yrs (Dr. Sharlett Iles).    Outpatient Medications Prior to Visit  Medication Sig Dispense Refill  . amLODipine (NORVASC) 5 MG tablet Take 1 tablet (5 mg total) by mouth daily. 90 tablet 3  . aspirin 81 MG tablet Take 81 mg by mouth daily.    Marland Kitchen atorvastatin (LIPITOR) 20 MG tablet TAKE 1 TABLET (20 MG TOTAL) BY MOUTH DAILY. 90 tablet 2  . Cholecalciferol (VITAMIN D3) 1000 UNITS CAPS Take by mouth daily.      Marland Kitchen lisinopril (PRINIVIL,ZESTRIL) 20 MG tablet TAKE 1 TABLET (20 MG TOTAL) BY MOUTH DAILY. 90 tablet 3  . Omega-3 Fatty Acids (FISH OIL) 1000 MG CAPS Take by mouth daily.      Marland Kitchen atenolol (TENORMIN) 25 MG tablet Take 1 tablet (25 mg total) by mouth every morning. 90 tablet 3  . Probiotic Product (PROBIOTIC DAILY PO) Take by mouth.     No facility-administered medications prior to visit.     No Known Allergies  ROS As per HPI  PE: Blood pressure 124/77, pulse (!) 40, temperature 97.6 F (36.4 C), temperature source  Oral, resp. rate 16, height 5' 3.25" (1.607 m), weight 206 lb 12 oz (93.8 kg), SpO2 97 %. Gen: Alert, well appearing.  Patient is oriented to person, place, time, and situation. [id number]: no injection, icteris, swelling, or exudate.  EOMI, PERRLA. Mouth: lips without lesion/swelling.  Oral mucosa pink and moist. Oropharynx without erythema, exudate, or swelling.  CV: Regular, bradycardic, no m/r/g.   LUNGS: CTA bilat, nonlabored resps, good aeration in all lung fields. EXT: no clubbing, cyanosis, or edema.    LABS:  Lab Results  Component Value Date   TSH 1.61 01/11/2016   Lab Results  Component Value Date   WBC 6.6 01/11/2016   HGB 14.5 01/11/2016   HCT 44.7 01/11/2016   MCV 81.1 01/11/2016   PLT 136.0 (L) 01/11/2016   Lab Results  Component Value Date   CREATININE 0.64 01/11/2016   BUN 17 01/11/2016   NA 140 01/11/2016   K 3.9 01/11/2016   CL 105 01/11/2016   CO2 25 01/11/2016   Lab Results  Component Value Date   ALT 17 01/11/2016   AST 16 01/11/2016   ALKPHOS 68 01/11/2016   BILITOT 0.8 01/11/2016   Lab Results  Component Value Date   CHOL 150 01/11/2016   Lab Results  Component Value Date  HDL 42.00 01/11/2016   Lab Results  Component Value Date   LDLCALC 86 01/11/2016   Lab Results  Component Value Date   TRIG 110.0 01/11/2016   Lab Results  Component Value Date   CHOLHDL 4 01/11/2016   Lab Results  Component Value Date   HGBA1C 6.4 01/17/2016    IMPRESSION AND PLAN:  Transfer pt;  1) DM 2, diet controlled. Hba1c today.  2) HTN: well controlled. We'll be stopping her atenolol today--see #3 below.  3) Bradycardia: regular rhythm.  Suspect beta blockade effect. She admits she is fatigued.  Denies orthostatic dizziness. We'll d/c atenolol. Instructions: Check your blood pressure and heart rate daily or every other day for 1 month.  Call for f/u appt if bp's persistently >150 on top or >90 on bottom.  Call if HR <40 or >110.  4)  Hyperlipidemia: tolerating statin.  Chol panel great 01/2016, as were AST and ALT.  5) Hx of adenomatous colon polyp: she is overdue for repeat colonoscopy. Will arrange for her to have repeat with Dr. Hilarie Fredrickson, since her prior one was with Dr. Sharlett Iles (retired).  An After Visit Summary was printed and given to the patient.   FOLLOW UP: Return in about 6 months (around 01/04/2017) for annual CPE with fasting labs the week prior.  Signed:  Crissie Sickles, MD           07/04/2016

## 2016-07-04 NOTE — Patient Instructions (Signed)
Check your blood pressure and heart rate daily or every other day for 1 month.  Call for f/u appt if bp's persistently >150 on top or >90 on bottom.  Call if HR <40 or >110.

## 2016-07-05 ENCOUNTER — Encounter: Payer: Self-pay | Admitting: Internal Medicine

## 2016-08-27 ENCOUNTER — Encounter: Payer: Self-pay | Admitting: *Deleted

## 2016-09-18 ENCOUNTER — Encounter: Payer: Self-pay | Admitting: Internal Medicine

## 2016-09-18 ENCOUNTER — Encounter (INDEPENDENT_AMBULATORY_CARE_PROVIDER_SITE_OTHER): Payer: Self-pay

## 2016-09-18 ENCOUNTER — Ambulatory Visit (INDEPENDENT_AMBULATORY_CARE_PROVIDER_SITE_OTHER): Payer: Medicare Other | Admitting: Internal Medicine

## 2016-09-18 VITALS — BP 168/80 | HR 56 | Ht 63.0 in | Wt 212.1 lb

## 2016-09-18 DIAGNOSIS — Z86010 Personal history of colonic polyps: Secondary | ICD-10-CM

## 2016-09-18 NOTE — Patient Instructions (Signed)
If you are age 71 or older, your body mass index should be between 23-30. Your Body mass index is 37.58 kg/m. If this is out of the aforementioned range listed, please consider follow up with your Primary Care Provider.  If you are age 32 or younger, your body mass index should be between 19-25. Your Body mass index is 37.58 kg/m. If this is out of the aformentioned range listed, please consider follow up with your Primary Care Provider.   Your physician has requested that you go to the basement for lab work before leaving today.

## 2016-09-18 NOTE — Progress Notes (Signed)
Patient ID: Catherine Daniel, female   DOB: December 29, 1945, 70 y.o.   MRN: [id number] HPI: Catherine Daniel is a 70 year old female with past medical history of subcentimeter tubular adenoma in 2011, hypertension, hyperlipidemia, diabetes who is seen in consultation at the request of Dr. Anitra Lauth to consider surveillance colonoscopy. She is here alone today. She reports that she is feeling well and denies any GI complaints. She is extremely anxious about colonoscopy and tearful. She reports her last colonoscopy was well but she felt terrible when waking up from the anesthesia. She also fears having oxygen or anything placed over her face. She is asking about other options for screening/surveillance.  She denies change in bowel habit, blood in her stool or melena. She denies abdominal pain. She was previous he having some abdominal gas and cramping but she is probiotic for a month and this symptom resolved completely. She reports a good appetite without nausea, vomiting, dysphagia or odynophagia. She denies frequent heartburn. She denies a family history of colorectal cancer.  Her last colonoscopy was performed by Dr. Verl Blalock on 05/26/2010. This revealed severe left-sided diverticulosis and a 5 mm polyp removed by hot snare found to be tubular adenoma. Five-year recall was recommended. Sedation for this procedure was with fentanyl and Versed at 75 g and 7 mg, respectively.  Past Medical History:  Diagnosis Date  . Arthritis   . Diabetes mellitus type II    Highest A1c 6.6% in 2014.  Diet-controlled.  . Diverticulosis   . History of cardiac catheterization 1999   normal  . Hyperlipidemia   . Hypertension   . Situational anxiety    Flying, heights, dentists: xanax prn works well  . Tubular adenoma of colon     Past Surgical History:  Procedure Laterality Date  . CHOLECYSTECTOMY  1997   for cholelithiasis  . COLONOSCOPY  05/31/2010   Descending colon and sigmoid diverticulosis-severe.   Polypectomy x 1.  Recall 5 yrs (Dr. Sharlett Iles).  . TONSILLECTOMY      Outpatient Medications Prior to Visit  Medication Sig Dispense Refill  . amLODipine (NORVASC) 5 MG tablet Take 1 tablet (5 mg total) by mouth daily. 90 tablet 3  . aspirin 81 MG tablet Take 81 mg by mouth daily.    Marland Kitchen atorvastatin (LIPITOR) 20 MG tablet TAKE 1 TABLET (20 MG TOTAL) BY MOUTH DAILY. 90 tablet 2  . lisinopril (PRINIVIL,ZESTRIL) 20 MG tablet TAKE 1 TABLET (20 MG TOTAL) BY MOUTH DAILY. 90 tablet 3  . Omega-3 Fatty Acids (FISH OIL) 1000 MG CAPS Take by mouth daily.      . Cholecalciferol (VITAMIN D3) 1000 UNITS CAPS Take by mouth daily.       No facility-administered medications prior to visit.     No Known Allergies  Family History  Problem Relation Age of Onset  . Hyperlipidemia Mother   . Coronary artery disease Mother   . Diabetes Sister     Social History  Substance Use Topics  . Smoking status: Former Smoker    Quit date: 12/03/1989  . Smokeless tobacco: Never Used  . Alcohol use Yes     Comment: wine once in a while    ROS: As per history of present illness, otherwise negative  BP (!) 168/80 (BP Location: Left Arm, Patient Position: Sitting, Cuff Size: Large)   Pulse (!) 56 Comment: irregular  Ht 5\' 3"  (1.6 m) Comment: height measured without shoes  Wt 212 lb 2 oz (96.2 kg)   BMI 37.58 kg/m  Constitutional: Well-developed and well-nourished. No distress. HEENT: Normocephalic and atraumatic. Conjunctivae are normal.  No scleral icterus. Neck: Neck supple. Trachea midline. Cardiovascular: Normal rate, regular rhythm and intact distal pulses.  Pulmonary/chest: Effort normal and breath sounds normal. No wheezing, rales or rhonchi. Abdominal: Soft, obese, nontender, nondistended. Bowel sounds active throughout.  Extremities: no clubbing, cyanosis, or edema Lymphadenopathy: No cervical adenopathy noted. Neurological: Alert and oriented to person place and time. Skin: Skin is warm and dry.  No rashes noted. Psychiatric: Normal mood and affect. Behavior is normal.  RELEVANT LABS AND IMAGING: CBC    Component Value Date/Time   WBC 6.6 01/11/2016 1003   RBC 5.51 (H) 01/11/2016 1003   HGB 14.5 01/11/2016 1003   HCT 44.7 01/11/2016 1003   PLT 136.0 (L) 01/11/2016 1003   MCV 81.1 01/11/2016 1003   MCHC 32.4 01/11/2016 1003   RDW 14.6 01/11/2016 1003   LYMPHSABS 1.9 01/11/2016 1003   MONOABS 0.6 01/11/2016 1003   EOSABS 0.1 01/11/2016 1003   BASOSABS 0.0 01/11/2016 1003    CMP     Component Value Date/Time   NA 136 07/04/2016 1448   K 5.0 07/04/2016 1448   CL 102 07/04/2016 1448   CO2 26 07/04/2016 1448   GLUCOSE 103 (H) 07/04/2016 1448   BUN 13 07/04/2016 1448   CREATININE 0.66 07/04/2016 1448   CALCIUM 9.8 07/04/2016 1448   PROT 6.9 01/11/2016 1003   ALBUMIN 4.1 01/11/2016 1003   AST 16 01/11/2016 1003   ALT 17 01/11/2016 1003   ALKPHOS 68 01/11/2016 1003   BILITOT 0.8 01/11/2016 1003   GFRNONAA 115.48 04/25/2010 0826   GFRAA 109 03/26/2008 0831    ASSESSMENT/PLAN: 70 year old female with past medical history of subcentimeter tubular adenoma in 2011, hypertension, hyperlipidemia, diabetes who is seen in consultation at the request of Dr. Anitra Lauth to consider surveillance colonoscopy.  1. Personal history of adenomatous colon polyps -- she is due for surveillance colonoscopy at this time but she is exceedingly anxious about repeat colonoscopy and the anesthesia associated with it. We discussed unsedated colonoscopy but she also fears it being painful. We discussed other nationally accepted screening modalities including barium enema and iFOB testing.  We discussed the colonoscopy has the high sensitivity and specificity regarding screening but with her anxiety and desire for another option I have recommended annual iFOB testing.  She understands that if this test is positive, colonoscopy would be recommended strongly. In this event she would be willing to have  repeat colonoscopy. We also discussed how colonoscopy would be with propofol and I expect she would do better with this for sedation than moderate sedation. She is happy with this plan      [id number] H Mcgowen, Md [address] [place], [place] [address]

## 2016-09-19 ENCOUNTER — Encounter: Payer: Self-pay | Admitting: Family Medicine

## 2016-09-27 ENCOUNTER — Other Ambulatory Visit (INDEPENDENT_AMBULATORY_CARE_PROVIDER_SITE_OTHER): Payer: Medicare Other

## 2016-09-27 DIAGNOSIS — Z86010 Personal history of colonic polyps: Secondary | ICD-10-CM | POA: Diagnosis not present

## 2016-09-27 LAB — FECAL OCCULT BLOOD, IMMUNOCHEMICAL: Fecal Occult Bld: NEGATIVE

## 2016-12-07 ENCOUNTER — Encounter: Payer: Self-pay | Admitting: Family Medicine

## 2016-12-07 ENCOUNTER — Ambulatory Visit (INDEPENDENT_AMBULATORY_CARE_PROVIDER_SITE_OTHER): Payer: Medicare Other | Admitting: Family Medicine

## 2016-12-07 VITALS — BP 154/82 | HR 75 | Temp 98.2°F | Resp 20 | Wt 209.0 lb

## 2016-12-07 DIAGNOSIS — J0100 Acute maxillary sinusitis, unspecified: Secondary | ICD-10-CM | POA: Diagnosis not present

## 2016-12-07 MED ORDER — DOXYCYCLINE HYCLATE 100 MG PO TABS: 100.0000 mg | ORAL_TABLET | Freq: Two times a day (BID) | ORAL | 0 refills | Status: DC

## 2016-12-07 NOTE — Progress Notes (Signed)
Chelbi Duncil , 1946/03/04, 71 y.o., female MRN: [id number] Patient Care Team    Relationship Specialty Notifications Start End  Tammi Sou, MD PCP - General Family Medicine  07/04/16   Jerene Bears, MD Consulting Physician Gastroenterology  09/19/16     CC: cough  Subjective: Pt presents for an acute OV with complaints of cough  of since xmas duration.  Associated symptoms include chest congestion, frontal sinus pressure.  Pt states all her family has been sick for the holidays.  Pt has tried mucinex/dayquil to ease their symptoms.   No Known Allergies Social History  Substance Use Topics  . Smoking status: Former Smoker    Quit date: 12/03/1989  . Smokeless tobacco: Never Used  . Alcohol use Yes     Comment: wine once in a while   Past Medical History:  Diagnosis Date  . Arthritis   . Diabetes mellitus type II    Highest A1c 6.6% in 2014.  Diet-controlled.  . Diverticulosis   . History of cardiac catheterization 1999   normal  . Hyperlipidemia   . Hypertension   . Situational anxiety    Flying, heights, dentists: xanax prn works well  . Tubular adenoma of colon 2011   At 09/19/16 GI visit she decided against repeat colonoscopy, and decided to pursue annual iFOB testing instead (Dr. Hilarie Fredrickson).   Past Surgical History:  Procedure Laterality Date  . CHOLECYSTECTOMY  1997   for cholelithiasis  . COLONOSCOPY  05/31/2010   Descending colon and sigmoid diverticulosis-severe.  Polypectomy x 1.  Recall 5 yrs (Dr. Sharlett Iles).  Pt chose not to get repeat colonoscopy due to bad experience with procedure in 2011.  She will do annal iFOB testing---has agreed to do colonoscopy IF one returns positive.  . TONSILLECTOMY     Family History  Problem Relation Age of Onset  . Hyperlipidemia Mother   . Coronary artery disease Mother   . Diabetes Sister    Allergies as of 12/07/2016   No Known Allergies     Medication List       Accurate as of 12/07/16  2:40 PM. Always use your  most recent med list.          amLODipine 5 MG tablet Commonly known as:  NORVASC Take 1 tablet (5 mg total) by mouth daily.   aspirin 81 MG tablet Take 81 mg by mouth daily.   atorvastatin 20 MG tablet Commonly known as:  LIPITOR TAKE 1 TABLET (20 MG TOTAL) BY MOUTH DAILY.   Calcium-Vitamin D 600-400 MG-UNIT Tabs Take 1 tablet by mouth 2 (two) times daily.   Fish Oil 1000 MG Caps Take by mouth daily.   lisinopril 20 MG tablet Commonly known as:  PRINIVIL,ZESTRIL TAKE 1 TABLET (20 MG TOTAL) BY MOUTH DAILY.       No results found for this or any previous visit (from the past 24 hour(s)). No results found.   ROS: Negative, with the exception of above mentioned in HPI   Objective:  BP (!) 154/82 (BP Location: Left Arm, Patient Position: Sitting, Cuff Size: Large)   Pulse 75   Temp 98.2 F (36.8 C)   Resp 20   Wt 209 lb (94.8 kg)   SpO2 97%   BMI 37.02 kg/m  Body mass index is 37.02 kg/m. Gen: Afebrile. No acute distress. Nontoxic in appearance, well developed, well nourished.  HENT: AT. [place]. Bilateral TM visualized wnl. MMM, no oral lesions. Bilateral nares with erythema, swelling  and drainage. Throat without erythema or exudates. TTP max sinus and frontal. Mild cough.  Eyes:Pupils Equal Round Reactive to light, Extraocular movements intact,  Conjunctiva without redness, discharge or icterus. Neck/lymp/endocrine: Supple,no lymphadenopathy CV: RRR  Chest: CTAB, no wheeze or crackles. Good air movement, normal resp effort.  Abd: Soft.. NTND. BS presnent.   Assessment/Plan: Pranaya Conerly is a 71 y.o. female present for acute OV for  Acute maxillary sinusitis, recurrence not specified Rest. Hydrate. Flonase. mucinex DM. Nasal saline. Doxy BID PRN F/u PRN  electronically signed by:  Howard Pouch, DO  [place]

## 2016-12-07 NOTE — Patient Instructions (Signed)
Rest, hydrate.  Flonase and mucinex DM (robitussin) Doxycyline prescribed for 10 days.  Nasal saline flushes a few times a day    Sinusitis, Adult Sinusitis is soreness and inflammation of your sinuses. Sinuses are hollow spaces in the bones around your face. They are located:  Around your eyes.  In the middle of your forehead.  Behind your nose.  In your cheekbones. Your sinuses and nasal passages are lined with a stringy fluid (mucus). Mucus normally drains out of your sinuses. When your nasal tissues get inflamed or swollen, the mucus can get trapped or blocked so air cannot flow through your sinuses. This lets bacteria, viruses, and funguses grow, and that leads to infection. Follow these instructions at home: Medicines  Take, use, or apply over-the-counter and prescription medicines only as told by your doctor. These may include nasal sprays.  If you were prescribed an antibiotic medicine, take it as told by your doctor. Do not stop taking the antibiotic even if you start to feel better. Hydrate and Humidify  Drink enough water to keep your pee (urine) clear or pale yellow.  Use a cool mist humidifier to keep the humidity level in your home above 50%.  Breathe in steam for 10-15 minutes, 3-4 times a day or as told by your doctor. You can do this in the bathroom while a hot shower is running.  Try not to spend time in cool or dry air. Rest  Rest as much as possible.  Sleep with your head raised (elevated).  Make sure to get enough sleep each night. General instructions  Put a warm, moist washcloth on your face 3-4 times a day or as told by your doctor. This will help with discomfort.  Wash your hands often with soap and water. If there is no soap and water, use hand sanitizer.  Do not smoke. Avoid being around people who are smoking (secondhand smoke).  Keep all follow-up visits as told by your doctor. This is important. Contact a doctor if:  You have a  fever.  Your symptoms get worse.  Your symptoms do not get better within 10 days. Get help right away if:  You have a very bad headache.  You cannot stop throwing up (vomiting).  You have pain or swelling around your face or eyes.  You have trouble seeing.  You feel confused.  Your neck is stiff.  You have trouble breathing. This information is not intended to replace advice given to you by your health care provider. Make sure you discuss any questions you have with your health care provider. Document Released: 05/07/2008 Document Revised: 07/15/2016 Document Reviewed: 09/14/2015 Elsevier Interactive Patient Education  2017 [employer].

## 2017-01-04 ENCOUNTER — Other Ambulatory Visit: Payer: Self-pay | Admitting: Family Medicine

## 2017-01-18 ENCOUNTER — Ambulatory Visit (INDEPENDENT_AMBULATORY_CARE_PROVIDER_SITE_OTHER): Payer: Medicare Other | Admitting: Family Medicine

## 2017-01-18 ENCOUNTER — Encounter: Payer: Self-pay | Admitting: Family Medicine

## 2017-01-18 VITALS — BP 157/83 | HR 57 | Temp 97.9°F | Resp 16 | Ht 63.0 in | Wt 202.8 lb

## 2017-01-18 DIAGNOSIS — E119 Type 2 diabetes mellitus without complications: Secondary | ICD-10-CM

## 2017-01-18 DIAGNOSIS — I10 Essential (primary) hypertension: Secondary | ICD-10-CM

## 2017-01-18 DIAGNOSIS — E7800 Pure hypercholesterolemia, unspecified: Secondary | ICD-10-CM

## 2017-01-18 DIAGNOSIS — Z0000 Encounter for general adult medical examination without abnormal findings: Secondary | ICD-10-CM

## 2017-01-18 LAB — LIPID PANEL
CHOL/HDL RATIO: 3
CHOLESTEROL: 160 mg/dL (ref 0–200)
HDL: 46.1 mg/dL (ref >39.00)
LDL CALC: 87 mg/dL (ref 0–99)
NonHDL: 114.32
TRIGLYCERIDES: 139 mg/dL (ref 0.0–149.0)
VLDL: 27.8 mg/dL (ref 0.0–40.0)

## 2017-01-18 LAB — COMPREHENSIVE METABOLIC PANEL
ALBUMIN: 4.5 g/dL (ref 3.5–5.2)
ALT: 22 U/L (ref 0–35)
AST: 21 U/L (ref 0–37)
Alkaline Phosphatase: 61 U/L (ref 39–117)
BUN: 18 mg/dL (ref 6–23)
CALCIUM: 10.1 mg/dL (ref 8.4–10.5)
CHLORIDE: 103 meq/L (ref 96–112)
CO2: 29 meq/L (ref 19–32)
Creatinine, Ser: 0.7 mg/dL (ref 0.40–1.20)
GFR: 87.49 mL/min (ref >60.00)
Glucose, Bld: 118 mg/dL — ABNORMAL HIGH (ref 70–99)
POTASSIUM: 4.6 meq/L (ref 3.5–5.1)
Sodium: 140 mEq/L (ref 135–145)
Total Bilirubin: 0.8 mg/dL (ref 0.2–1.2)
Total Protein: 7.1 g/dL (ref 6.0–8.3)

## 2017-01-18 LAB — HEMOGLOBIN A1C: Hgb A1c MFr Bld: 6.5 % (ref 4.6–6.5)

## 2017-01-18 MED ORDER — LISINOPRIL 20 MG PO TABS: ORAL_TABLET | ORAL | 3 refills | Status: DC

## 2017-01-18 MED ORDER — AMLODIPINE BESYLATE 5 MG PO TABS: 5.0000 mg | ORAL_TABLET | Freq: Every day | ORAL | 3 refills | Status: DC

## 2017-01-18 NOTE — Progress Notes (Signed)
OFFICE VISIT  01/18/2017   CC:  Chief Complaint  Patient presents with  . Follow-up    RCI, pt is fasting.    HPI:    Patient is a 71 y.o. Caucasian female who presents for 6 mo f/u DM 2, HTN, HLD.  DM: no home glucose monitoring.  +Diabetic diet.  No exercise due to cold weather lately. Feet: no burning, tingling, or numbness.  HTN: monitors every couple days, gets 130s/70s consistently.  Pt reports hx of white coat component to her bp in the past.  HLD: tolerating 20mg  atorva qd.  ROS: no palpitatins, no CP, no dizziness, no SOB, no muscle aches, no polyuria or polydipsia.  Past Medical History:  Diagnosis Date  . Arthritis   . Diabetes mellitus type II    Highest A1c 6.6% in 2014.  Diet-controlled.  . Diverticulosis   . History of cardiac catheterization 1999   normal  . Hyperlipidemia   . Hypertension   . Situational anxiety    Flying, heights, dentists: xanax prn works well  . Tubular adenoma of colon 2011   At 09/19/16 GI visit she decided against repeat colonoscopy, and decided to pursue annual iFOB testing instead (Dr. Hilarie Fredrickson) (iFOB neg 09/2016)    Past Surgical History:  Procedure Laterality Date  . CHOLECYSTECTOMY  1997   for cholelithiasis  . COLONOSCOPY  05/31/2010   Descending colon and sigmoid diverticulosis-severe.  Polypectomy x 1.  Recall 5 yrs (Dr. Sharlett Iles).  Pt chose not to get repeat colonoscopy due to bad experience with procedure in 2011.  She will do annal iFOB testing---has agreed to do colonoscopy IF one returns positive.  . TONSILLECTOMY      Outpatient Medications Prior to Visit  Medication Sig Dispense Refill  . aspirin 81 MG tablet Take 81 mg by mouth daily.    Marland Kitchen atorvastatin (LIPITOR) 20 MG tablet TAKE 1 TABLET (20 MG TOTAL) BY MOUTH DAILY. 90 tablet 2  . Calcium Carb-Cholecalciferol (CALCIUM-VITAMIN D) 600-400 MG-UNIT TABS Take 1 tablet by mouth 2 (two) times daily.    . Omega-3 Fatty Acids (FISH OIL) 1000 MG CAPS Take by mouth  daily.      Marland Kitchen amLODipine (NORVASC) 5 MG tablet Take 1 tablet (5 mg total) by mouth daily. 90 tablet 3  . lisinopril (PRINIVIL,ZESTRIL) 20 MG tablet TAKE 1 TABLET (20 MG TOTAL) BY MOUTH DAILY. 90 tablet 3  . doxycycline (VIBRA-TABS) 100 MG tablet Take 1 tablet (100 mg total) by mouth 2 (two) times daily. (Patient not taking: Reported on 01/18/2017) 20 tablet 0   No facility-administered medications prior to visit.     No Known Allergies  ROS As per HPI  PE: Blood pressure (!) 157/83, pulse (!) 57, temperature 97.9 F (36.6 C), temperature source Oral, resp. rate 16, height 5\' 3"  (1.6 m), weight 202 lb 12 oz (92 kg), SpO2 96 %. Gen: Alert, well appearing.  Patient is oriented to person, place, time, and situation. CV: RRR with occ brief pause, no m/r/g.   LUNGS: CTA bilat, nonlabored resps, good aeration in all lung fields. EXT: no clubbing, cyanosis, or edema.  Foot exam - bilateral normal; no swelling, tenderness or skin or vascular lesions. Color and temperature is normal. Sensation is intact. Peripheral pulses are palpable. Toenails are normal.   LABS:  Lab Results  Component Value Date   TSH 1.61 01/11/2016   Lab Results  Component Value Date   WBC 6.6 01/11/2016   HGB 14.5 01/11/2016   HCT  44.7 01/11/2016   MCV 81.1 01/11/2016   PLT 136.0 (L) 01/11/2016   Lab Results  Component Value Date   CREATININE 0.66 07/04/2016   BUN 13 07/04/2016   NA 136 07/04/2016   K 5.0 07/04/2016   CL 102 07/04/2016   CO2 26 07/04/2016   Lab Results  Component Value Date   ALT 17 01/11/2016   AST 16 01/11/2016   ALKPHOS 68 01/11/2016   BILITOT 0.8 01/11/2016   Lab Results  Component Value Date   CHOL 150 01/11/2016   Lab Results  Component Value Date   HDL 42.00 01/11/2016   Lab Results  Component Value Date   LDLCALC 86 01/11/2016   Lab Results  Component Value Date   TRIG 110.0 01/11/2016   Lab Results  Component Value Date   CHOLHDL 4 01/11/2016   Lab Results   Component Value Date   HGBA1C 6.6 (H) 07/04/2016   IMPRESSION AND PLAN:  1) DM 2, diet controlled.  Historically well controlled. Feet exam normal today. Reminded pt today that she is 1 yr overdue for her diabetic retinopathy screening exam. HbA1c today.  2) HTN; The current medical regimen is effective;  continue present plan and medications. Lytes/cr today.  3) HLD: tolerating statin.  Recheck FLP and AST/ALT. Will do RF of atorva 20mg  qd if lipids return stable (90 day supply w/3 RFs).  4) Preventative health care: zostavax rx given today.  Pt declined flu vaccine today.  FOLLOW UP: Return in about 3 months (around 04/17/2017) for routine chronic illness f/u.  Signed:  Crissie Sickles, MD           01/18/2017

## 2017-01-18 NOTE — Progress Notes (Signed)
Pre visit review using our clinic review tool, if applicable. No additional management support is needed unless otherwise documented below in the visit note. 

## 2017-01-27 ENCOUNTER — Other Ambulatory Visit: Payer: Self-pay | Admitting: Family Medicine

## 2017-01-29 NOTE — Telephone Encounter (Signed)
Denied.  Sent to the pharmacy on 01/18/17 by Dr. Idelle Leech office.  Message sent to the pharmacy to check file.

## 2017-05-07 ENCOUNTER — Other Ambulatory Visit: Payer: Self-pay | Admitting: Family Medicine

## 2017-05-07 DIAGNOSIS — Z1231 Encounter for screening mammogram for malignant neoplasm of breast: Secondary | ICD-10-CM

## 2017-05-29 ENCOUNTER — Ambulatory Visit (INDEPENDENT_AMBULATORY_CARE_PROVIDER_SITE_OTHER): Payer: Medicare Other

## 2017-05-29 DIAGNOSIS — Z1231 Encounter for screening mammogram for malignant neoplasm of breast: Secondary | ICD-10-CM | POA: Diagnosis not present

## 2017-09-16 ENCOUNTER — Other Ambulatory Visit: Payer: Self-pay

## 2017-09-16 DIAGNOSIS — Z1212 Encounter for screening for malignant neoplasm of rectum: Principal | ICD-10-CM

## 2017-09-16 DIAGNOSIS — Z1211 Encounter for screening for malignant neoplasm of colon: Secondary | ICD-10-CM

## 2017-10-11 ENCOUNTER — Other Ambulatory Visit: Payer: Self-pay | Admitting: Family Medicine

## 2018-01-07 ENCOUNTER — Telehealth: Payer: Self-pay

## 2018-01-07 NOTE — Telephone Encounter (Signed)
Refill request received from pharmacy for Amlodipine. Patient last seen 01/18/17. Message left for patient to return call to schedule an appointment and discuss refill on medication.

## 2018-01-09 ENCOUNTER — Other Ambulatory Visit: Payer: Self-pay

## 2018-01-09 MED ORDER — AMLODIPINE BESYLATE 5 MG PO TABS: 5.0000 mg | ORAL_TABLET | Freq: Every day | ORAL | 0 refills | Status: DC

## 2018-01-09 NOTE — Telephone Encounter (Signed)
30 day supply sent with no refill on amlodipine. Message left for patient to return call and schedule an appt. Message also sent to pharmacy.

## 2018-01-14 ENCOUNTER — Encounter: Payer: Self-pay | Admitting: Family Medicine

## 2018-01-14 DIAGNOSIS — E119 Type 2 diabetes mellitus without complications: Secondary | ICD-10-CM | POA: Insufficient documentation

## 2018-01-15 ENCOUNTER — Encounter: Payer: Self-pay | Admitting: Family Medicine

## 2018-01-15 ENCOUNTER — Ambulatory Visit (INDEPENDENT_AMBULATORY_CARE_PROVIDER_SITE_OTHER): Payer: Medicare Other | Admitting: Family Medicine

## 2018-01-15 ENCOUNTER — Telehealth: Payer: Self-pay | Admitting: Family Medicine

## 2018-01-15 ENCOUNTER — Other Ambulatory Visit: Payer: Self-pay

## 2018-01-15 VITALS — BP 126/74 | HR 74 | Temp 97.5°F | Wt 208.0 lb

## 2018-01-15 DIAGNOSIS — E785 Hyperlipidemia, unspecified: Secondary | ICD-10-CM | POA: Diagnosis not present

## 2018-01-15 DIAGNOSIS — E119 Type 2 diabetes mellitus without complications: Secondary | ICD-10-CM | POA: Diagnosis not present

## 2018-01-15 DIAGNOSIS — I10 Essential (primary) hypertension: Secondary | ICD-10-CM | POA: Diagnosis not present

## 2018-01-15 LAB — COMPREHENSIVE METABOLIC PANEL
ALK PHOS: 67 U/L (ref 39–117)
ALT: 23 U/L (ref 0–35)
AST: 21 U/L (ref 0–37)
Albumin: 4.5 g/dL (ref 3.5–5.2)
BILIRUBIN TOTAL: 0.7 mg/dL (ref 0.2–1.2)
BUN: 16 mg/dL (ref 6–23)
CALCIUM: 10 mg/dL (ref 8.4–10.5)
CO2: 27 mEq/L (ref 19–32)
CREATININE: 0.76 mg/dL (ref 0.40–1.20)
Chloride: 103 mEq/L (ref 96–112)
GFR: 79.35 mL/min (ref >60.00)
Glucose, Bld: 135 mg/dL — ABNORMAL HIGH (ref 70–99)
Potassium: 4.7 mEq/L (ref 3.5–5.1)
Sodium: 139 mEq/L (ref 135–145)
TOTAL PROTEIN: 7.2 g/dL (ref 6.0–8.3)

## 2018-01-15 LAB — LIPID PANEL
Cholesterol: 164 mg/dL (ref 0–200)
HDL: 43.4 mg/dL (ref >39.00)
LDL CALC: 103 mg/dL — AB (ref 0–99)
NonHDL: 120.93
TRIGLYCERIDES: 89 mg/dL (ref 0.0–149.0)
Total CHOL/HDL Ratio: 4
VLDL: 17.8 mg/dL (ref 0.0–40.0)

## 2018-01-15 LAB — HEMOGLOBIN A1C: Hgb A1c MFr Bld: 6.8 % — ABNORMAL HIGH (ref 4.6–6.5)

## 2018-01-15 MED ORDER — LISINOPRIL 20 MG PO TABS: ORAL_TABLET | ORAL | 2 refills | Status: DC

## 2018-01-15 MED ORDER — ATORVASTATIN CALCIUM 20 MG PO TABS: ORAL_TABLET | ORAL | 2 refills | Status: DC

## 2018-01-15 MED ORDER — AMLODIPINE BESYLATE 5 MG PO TABS: 5.0000 mg | ORAL_TABLET | Freq: Every day | ORAL | 2 refills | Status: DC

## 2018-01-15 NOTE — Telephone Encounter (Signed)
Refills sent on requested medications.

## 2018-01-15 NOTE — Progress Notes (Signed)
OFFICE VISIT  01/15/2018   CC:  Chief Complaint  Patient presents with  . Follow-up    RCI   HPI:    Patient is a 72 y.o.  female who presents for f/u HTN, HLD, DM 2--I last saw her 1 year ago.  HTN: home monitoring 128-135/66-77 avg, P 60s.  HLD: tolerating atorva taking daily, no side effect.  DM: no home glucose testing.  Veggies and fruits, more, avoiding past too much. Exercise: none.    ROS: no CP, no palpitations, no HAs, no LE swelling, no SOB, no polyuria/polydipsia.   Past Medical History:  Diagnosis Date  . Arthritis   . Diabetes mellitus type II    Highest A1c 6.6% in 2014.  Diet-controlled.  . Diverticulosis   . History of cardiac catheterization 1999   normal  . Hyperlipidemia   . Hypertension   . Situational anxiety    Flying, heights, dentists: xanax prn works well  . Tubular adenoma of colon 2011   At 09/19/16 GI visit she decided against repeat colonoscopy, and decided to pursue annual iFOB testing instead (Dr. Hilarie Fredrickson) (iFOB neg 09/2016)    Past Surgical History:  Procedure Laterality Date  . CHOLECYSTECTOMY  1997   for cholelithiasis  . COLONOSCOPY  05/31/2010   Descending colon and sigmoid diverticulosis-severe.  Polypectomy x 1.  Recall 5 yrs (Dr. Sharlett Iles).  Pt chose not to get repeat colonoscopy due to bad experience with procedure in 2011.  She will do annal iFOB testing---has agreed to do colonoscopy IF one returns positive.  . TONSILLECTOMY      Outpatient Medications Prior to Visit  Medication Sig Dispense Refill  . amLODipine (NORVASC) 5 MG tablet Take 1 tablet (5 mg total) by mouth daily. Needs appointment for further refills 30 tablet 0  . aspirin 81 MG tablet Take 81 mg by mouth daily.    Marland Kitchen atorvastatin (LIPITOR) 20 MG tablet TAKE 1 TABLET (20 MG TOTAL) BY MOUTH DAILY. 90 tablet 2  . Calcium Carb-Cholecalciferol (CALCIUM-VITAMIN D) 600-400 MG-UNIT TABS Take 1 tablet by mouth 2 (two) times daily.    Marland Kitchen lisinopril (PRINIVIL,ZESTRIL)  20 MG tablet TAKE 1 TABLET (20 MG TOTAL) BY MOUTH DAILY. 90 tablet 3  . Omega-3 Fatty Acids (FISH OIL) 1000 MG CAPS Take by mouth daily.       No facility-administered medications prior to visit.     No Known Allergies  ROS As per HPI  PE:BP recheck today was NORMAL. Blood pressure 126/74, pulse 74, temperature (!) 97.5 F (36.4 C), temperature source Oral, weight 208 lb (94.3 kg), SpO2 96 %. Gen: Alert, well appearing.  Patient is oriented to person, place, time, and situation. AFFECT: pleasant, lucid thought and speech. CV: RRR, no m/r/g.   LUNGS: CTA bilat, nonlabored resps, good aeration in all lung fields. EXT: no clubbing, cyanosis, or edema.    LABS:  Lab Results  Component Value Date   TSH 1.61 01/11/2016   Lab Results  Component Value Date   WBC 6.6 01/11/2016   HGB 14.5 01/11/2016   HCT 44.7 01/11/2016   MCV 81.1 01/11/2016   PLT 136.0 (L) 01/11/2016   Lab Results  Component Value Date   CREATININE 0.70 01/18/2017   BUN 18 01/18/2017   NA 140 01/18/2017   K 4.6 01/18/2017   CL 103 01/18/2017   CO2 29 01/18/2017   Lab Results  Component Value Date   ALT 22 01/18/2017   AST 21 01/18/2017   ALKPHOS 61  01/18/2017   BILITOT 0.8 01/18/2017   Lab Results  Component Value Date   CHOL 160 01/18/2017   Lab Results  Component Value Date   HDL 46.10 01/18/2017   Lab Results  Component Value Date   LDLCALC 87 01/18/2017   Lab Results  Component Value Date   TRIG 139.0 01/18/2017   Lab Results  Component Value Date   CHOLHDL 3 01/18/2017   Lab Results  Component Value Date   HGBA1C 6.5 01/18/2017   IMPRESSION AND PLAN:  1) HTN: The current medical regimen is effective;  continue present plan and medications. Lytes/cr today.  2) Hypercholesterolemia: tolerating statin. FLP and AST/ALT today.  3) DM 2, diet controlled. HbA1c today. Encouraged more focus on low carb diet and increased exercise. Encouraged pt to get annual diab retpthy  exam---need to give pt optometrist info when we call her back to report her lab results. She declined flu vaccine. Encouraged pt to arrange diab retpthy exam--gave info on Dr. Macarthur Critchley, optom.  An After Visit Summary was printed and given to the patient.  FOLLOW UP: Return in about 6 months (around 07/15/2018) for routine chronic illness f/u.  Signed:  Crissie Sickles, MD           01/15/2018

## 2018-01-15 NOTE — Telephone Encounter (Signed)
Patient stated at check out she forgot to mention she needs the following meds refilled:  amLODipine (NORVASC) 5 MG tablet  atorvastatin (LIPITOR) 20 MG tablet  lisinopril (PRINIVIL,ZESTRIL) 20 MG tablet  Pharmacy- [facility].

## 2018-01-15 NOTE — Telephone Encounter (Signed)
Refill sent.

## 2018-02-20 ENCOUNTER — Telehealth: Payer: Self-pay | Admitting: Family Medicine

## 2018-02-20 NOTE — Telephone Encounter (Signed)
Pt declined AWV. °

## 2018-06-09 ENCOUNTER — Other Ambulatory Visit: Payer: Self-pay | Admitting: Family Medicine

## 2018-06-09 DIAGNOSIS — Z1239 Encounter for other screening for malignant neoplasm of breast: Secondary | ICD-10-CM

## 2018-06-12 ENCOUNTER — Ambulatory Visit (INDEPENDENT_AMBULATORY_CARE_PROVIDER_SITE_OTHER): Payer: Medicare Other

## 2018-06-12 DIAGNOSIS — Z1231 Encounter for screening mammogram for malignant neoplasm of breast: Secondary | ICD-10-CM

## 2018-06-12 DIAGNOSIS — Z1239 Encounter for other screening for malignant neoplasm of breast: Secondary | ICD-10-CM

## 2018-07-16 ENCOUNTER — Telehealth: Payer: Self-pay | Admitting: Family Medicine

## 2018-07-16 ENCOUNTER — Encounter: Payer: Self-pay | Admitting: Family Medicine

## 2018-07-16 ENCOUNTER — Ambulatory Visit (INDEPENDENT_AMBULATORY_CARE_PROVIDER_SITE_OTHER): Payer: Medicare Other | Admitting: Family Medicine

## 2018-07-16 VITALS — BP 157/65 | HR 78 | Temp 97.5°F | Resp 16 | Ht 63.5 in | Wt 208.1 lb

## 2018-07-16 DIAGNOSIS — I10 Essential (primary) hypertension: Secondary | ICD-10-CM

## 2018-07-16 DIAGNOSIS — E119 Type 2 diabetes mellitus without complications: Secondary | ICD-10-CM

## 2018-07-16 DIAGNOSIS — E7800 Pure hypercholesterolemia, unspecified: Secondary | ICD-10-CM

## 2018-07-16 DIAGNOSIS — E669 Obesity, unspecified: Secondary | ICD-10-CM

## 2018-07-16 LAB — BASIC METABOLIC PANEL
BUN: 15 mg/dL (ref 6–23)
CO2: 28 meq/L (ref 19–32)
Calcium: 10.5 mg/dL (ref 8.4–10.5)
Chloride: 102 mEq/L (ref 96–112)
Creatinine, Ser: 0.88 mg/dL (ref 0.40–1.20)
GFR: 66.91 mL/min (ref >60.00)
GLUCOSE: 142 mg/dL — AB (ref 70–99)
POTASSIUM: 4 meq/L (ref 3.5–5.1)
SODIUM: 140 meq/L (ref 135–145)

## 2018-07-16 LAB — HEMOGLOBIN A1C: Hgb A1c MFr Bld: 6.9 % — ABNORMAL HIGH (ref 4.6–6.5)

## 2018-07-16 MED ORDER — TETANUS-DIPHTH-ACELL PERTUSSIS 5-2.5-18.5 LF-MCG/0.5 IM SUSP: 0.5000 mL | Freq: Once | INTRAMUSCULAR | 0 refills | Status: AC

## 2018-07-16 MED ORDER — ATORVASTATIN CALCIUM 20 MG PO TABS: ORAL_TABLET | ORAL | 2 refills | Status: DC

## 2018-07-16 NOTE — Telephone Encounter (Signed)
Opened in error

## 2018-07-16 NOTE — Progress Notes (Signed)
OFFICE VISIT  07/16/2018   CC:  Chief Complaint  Patient presents with  . Follow-up    RCI, pt is fasting.      HPI:    Patient is a 72 y.o.  female who presents for 6 mo f/u DM 2, HTN, HLD.  DM: no home gluc monitoring.  She eats a pretty good diabetic diet. Doing some exercise, has some L knee pain that limits her--seeing ortho. Feet--> no burning, tingling or numbness.  HTN: home bp monitoring avg <130/80.  BP a little up today-->she is very nervous as usual.  Hx of white coat HTN.  HLD:  Tolerating atorva 20mg  qd.  ROS: no CP, no SOB, no wheezing, no cough, no dizziness, no HAs, no rashes, no melena/hematochezia.  No polyuria or polydipsia.  No myalgias or arthralgias.  Past Medical History:  Diagnosis Date  . Arthritis   . Diabetes mellitus type II    Highest A1c 6.6% in 2014.  Diet-controlled.  . Diverticulosis   . History of cardiac catheterization 1999   normal  . Hyperlipidemia   . Hypertension   . Situational anxiety    Flying, heights, dentists: xanax prn works well  . Tubular adenoma of colon 2011   At 09/19/16 GI visit she decided against repeat colonoscopy, and decided to pursue annual iFOB testing instead (Dr. Hilarie Fredrickson) (iFOB neg 09/2016)    Past Surgical History:  Procedure Laterality Date  . CHOLECYSTECTOMY  1997   for cholelithiasis  . COLONOSCOPY  05/31/2010   Descending colon and sigmoid diverticulosis-severe.  Polypectomy x 1.  Recall 5 yrs (Dr. Sharlett Iles).  Pt chose not to get repeat colonoscopy due to bad experience with procedure in 2011.  She will do annal iFOB testing---has agreed to do colonoscopy IF one returns positive.  . TONSILLECTOMY      Outpatient Medications Prior to Visit  Medication Sig Dispense Refill  . amLODipine (NORVASC) 5 MG tablet Take 1 tablet (5 mg total) by mouth daily. 90 tablet 2  . aspirin 81 MG tablet Take 81 mg by mouth daily.    . Calcium Carb-Cholecalciferol (CALCIUM-VITAMIN D) 600-400 MG-UNIT TABS Take 1  tablet by mouth 2 (two) times daily.    Marland Kitchen lisinopril (PRINIVIL,ZESTRIL) 20 MG tablet TAKE 1 TABLET (20 MG TOTAL) BY MOUTH DAILY. 90 tablet 2  . Omega-3 Fatty Acids (FISH OIL) 1000 MG CAPS Take by mouth daily.      Marland Kitchen atorvastatin (LIPITOR) 20 MG tablet TAKE 1 TABLET (20 MG TOTAL) BY MOUTH DAILY. 90 tablet 2   No facility-administered medications prior to visit.     No Known Allergies  ROS As per HPI  PE: Blood pressure (!) 157/65, pulse 78, temperature (!) 97.5 F (36.4 C), temperature source Oral, resp. rate 16, height 5' 3.5" (1.613 m), weight 208 lb 2 oz (94.4 kg), SpO2 97 %. Gen: Alert, well appearing.  Patient is oriented to person, place, time, and situation. AFFECT: pleasant, lucid thought and speech.  Nervous. CV: RRR, occ ectopic beat,  no m/r/g.   LUNGS: CTA bilat, nonlabored resps, good aeration in all lung fields. EXT: no clubbing, cyanosis, or edema.  Foot exam - bilateral normal; no swelling, tenderness or skin or vascular lesions. Color and temperature is normal. Sensation is intact. Peripheral pulses are palpable. Toenails are normal.   LABS:  Lab Results  Component Value Date   TSH 1.61 01/11/2016   Lab Results  Component Value Date   WBC 6.6 01/11/2016   HGB 14.5  01/11/2016   HCT 44.7 01/11/2016   MCV 81.1 01/11/2016   PLT 136.0 (L) 01/11/2016   Lab Results  Component Value Date   CREATININE 0.76 01/15/2018   BUN 16 01/15/2018   NA 139 01/15/2018   K 4.7 01/15/2018   CL 103 01/15/2018   CO2 27 01/15/2018   Lab Results  Component Value Date   ALT 23 01/15/2018   AST 21 01/15/2018   ALKPHOS 67 01/15/2018   BILITOT 0.7 01/15/2018   Lab Results  Component Value Date   CHOL 164 01/15/2018   Lab Results  Component Value Date   HDL 43.40 01/15/2018   Lab Results  Component Value Date   LDLCALC 103 (H) 01/15/2018   Lab Results  Component Value Date   TRIG 89.0 01/15/2018   Lab Results  Component Value Date   CHOLHDL 4 01/15/2018   Lab  Results  Component Value Date   HGBA1C 6.8 (H) 01/15/2018    IMPRESSION AND PLAN:  1) DM 2: historically well controlled on diet alone. No home gluc monitoring. Check A1c today.  Reminded pt of need for annual eye exam. Feet exam normal today.  2) HTN: The current medical regimen is effective;  continue present plan and medications. Lytes/cr today.  3) HLD: tolerating statin.  LDL 103 six months ago, will repeat in 6 mo.  4) Preventative health care: Tdap today.  An After Visit Summary was printed and given to the patient.  FOLLOW UP: Return in about 3 months (around 10/16/2018) for routine chronic illness f/u.  Signed:  Crissie Sickles, MD           07/16/2018

## 2018-07-16 NOTE — Addendum Note (Signed)
Addended by: Onalee Hua on: 07/16/2018 11:19 AM   Modules accepted: Orders

## 2018-10-09 ENCOUNTER — Other Ambulatory Visit: Payer: Self-pay | Admitting: Family Medicine

## 2018-11-08 ENCOUNTER — Other Ambulatory Visit: Payer: Self-pay | Admitting: Family Medicine

## 2019-01-16 ENCOUNTER — Ambulatory Visit (INDEPENDENT_AMBULATORY_CARE_PROVIDER_SITE_OTHER): Payer: Medicare Other | Admitting: Family Medicine

## 2019-01-16 ENCOUNTER — Encounter: Payer: Self-pay | Admitting: Family Medicine

## 2019-01-16 VITALS — BP 170/75 | HR 71 | Temp 97.5°F | Resp 16 | Ht 63.5 in | Wt 207.1 lb

## 2019-01-16 DIAGNOSIS — I10 Essential (primary) hypertension: Secondary | ICD-10-CM

## 2019-01-16 DIAGNOSIS — E7800 Pure hypercholesterolemia, unspecified: Secondary | ICD-10-CM

## 2019-01-16 DIAGNOSIS — E119 Type 2 diabetes mellitus without complications: Secondary | ICD-10-CM | POA: Diagnosis not present

## 2019-01-16 LAB — COMPREHENSIVE METABOLIC PANEL
ALBUMIN: 4.6 g/dL (ref 3.5–5.2)
ALK PHOS: 76 U/L (ref 39–117)
ALT: 28 U/L (ref 0–35)
AST: 22 U/L (ref 0–37)
BILIRUBIN TOTAL: 0.7 mg/dL (ref 0.2–1.2)
BUN: 11 mg/dL (ref 6–23)
CALCIUM: 10.1 mg/dL (ref 8.4–10.5)
CO2: 26 mEq/L (ref 19–32)
Chloride: 103 mEq/L (ref 96–112)
Creatinine, Ser: 0.77 mg/dL (ref 0.40–1.20)
GFR: 73.33 mL/min (ref >60.00)
Glucose, Bld: 139 mg/dL — ABNORMAL HIGH (ref 70–99)
POTASSIUM: 4.3 meq/L (ref 3.5–5.1)
Sodium: 139 mEq/L (ref 135–145)
TOTAL PROTEIN: 7.1 g/dL (ref 6.0–8.3)

## 2019-01-16 LAB — HEMOGLOBIN A1C: HEMOGLOBIN A1C: 6.7 % — AB (ref 4.6–6.5)

## 2019-01-16 LAB — LIPID PANEL
CHOLESTEROL: 157 mg/dL (ref 0–200)
HDL: 46.8 mg/dL (ref >39.00)
LDL Cholesterol: 89 mg/dL (ref 0–99)
NONHDL: 110.64
TRIGLYCERIDES: 110 mg/dL (ref 0.0–149.0)
Total CHOL/HDL Ratio: 3
VLDL: 22 mg/dL (ref 0.0–40.0)

## 2019-01-16 NOTE — Progress Notes (Signed)
OFFICE VISIT  01/16/2019   CC:  Chief Complaint  Patient presents with  . Follow-up    RCI, pt is fasting.    HPI:    Patient is a 73 y.o. Caucasian female who presents for 6 mo f/u DM 2, HTN, HLD.  Feeling well, no complaints EXCEPT that she is very anxious about her appt today as per her usual. Has high anxiety about health visits--"I was up and down all night dreading this".  DM 2: no home gluc monitoring.  Trying to eat a fairly good diabetic diet.  HTN: bp's consistently 120s/70s or better at home.  Hasn't taken med today yet.  She is very anxious about coming here EVERY TIME.  HLD: working on diet.  Takes statin daily w/out problem. Exercises some: walking for the most part, but has a bad knee.  ROS: no HAs, no CP, no SOB, no wheezing, no cough, no dizziness, no HAs, no rashes, no melena/hematochezia.  No polyuria or polydipsia.   Past Medical History:  Diagnosis Date  . Arthritis   . Diabetes mellitus type II    Highest A1c 6.6% in 2014.  Diet-controlled.  . Diverticulosis   . History of cardiac catheterization 1999   normal  . Hyperlipidemia   . Hypertension   . Situational anxiety    Flying, heights, dentists: xanax prn works well  . Tubular adenoma of colon 2011   At 09/19/16 GI visit she decided against repeat colonoscopy, and decided to pursue annual iFOB testing instead (Dr. Hilarie Fredrickson) (iFOB neg 09/2016)    Past Surgical History:  Procedure Laterality Date  . CHOLECYSTECTOMY  1997   for cholelithiasis  . COLONOSCOPY  05/31/2010   Descending colon and sigmoid diverticulosis-severe.  Polypectomy x 1.  Recall 5 yrs (Dr. Sharlett Iles).  Pt chose not to get repeat colonoscopy due to bad experience with procedure in 2011.  She will do annal iFOB testing---has agreed to do colonoscopy IF one returns positive.  . TONSILLECTOMY      Outpatient Medications Prior to Visit  Medication Sig Dispense Refill  . amLODipine (NORVASC) 5 MG tablet TAKE 1 TABLET BY MOUTH EVERY  DAY 90 tablet 1  . aspirin 81 MG tablet Take 81 mg by mouth daily.    Marland Kitchen atorvastatin (LIPITOR) 20 MG tablet TAKE 1 TABLET (20 MG TOTAL) BY MOUTH DAILY. 90 tablet 2  . Calcium Carb-Cholecalciferol (CALCIUM-VITAMIN D) 600-400 MG-UNIT TABS Take 1 tablet by mouth 2 (two) times daily.    Marland Kitchen lisinopril (PRINIVIL,ZESTRIL) 20 MG tablet TAKE 1 TABLET BY MOUTH EVERY DAY 90 tablet 0  . Omega-3 Fatty Acids (FISH OIL) 1000 MG CAPS Take by mouth daily.       No facility-administered medications prior to visit.     No Known Allergies  ROS As per HPI  PE: Blood pressure (!) 170/75, pulse 71, temperature (!) 97.5 F (36.4 C), temperature source Oral, resp. rate 16, height 5' 3.5" (1.613 m), weight 207 lb 2 oz (94 kg), SpO2 97 %. Body mass index is 36.12 kg/m.  Gen: Alert, well appearing.  Patient is oriented to person, place, time, and situation. AFFECT: pleasant, lucid thought and speech. CV: RRR, no m/r/g.   LUNGS: CTA bilat, nonlabored resps, good aeration in all lung fields. EXT: no clubbing or cyanosis.  No pitting edema.    LABS:  Lab Results  Component Value Date   TSH 1.61 01/11/2016   Lab Results  Component Value Date   WBC 6.6 01/11/2016  HGB 14.5 01/11/2016   HCT 44.7 01/11/2016   MCV 81.1 01/11/2016   PLT 136.0 (L) 01/11/2016   Lab Results  Component Value Date   CREATININE 0.88 07/16/2018   BUN 15 07/16/2018   NA 140 07/16/2018   K 4.0 07/16/2018   CL 102 07/16/2018   CO2 28 07/16/2018   Lab Results  Component Value Date   ALT 23 01/15/2018   AST 21 01/15/2018   ALKPHOS 67 01/15/2018   BILITOT 0.7 01/15/2018   Lab Results  Component Value Date   CHOL 164 01/15/2018   Lab Results  Component Value Date   HDL 43.40 01/15/2018   Lab Results  Component Value Date   LDLCALC 103 (H) 01/15/2018   Lab Results  Component Value Date   TRIG 89.0 01/15/2018   Lab Results  Component Value Date   CHOLHDL 4 01/15/2018   Lab Results  Component Value Date    HGBA1C 6.9 (H) 07/16/2018   IMPRESSION AND PLAN:  1) DM 2, diet controlled. HbA1c today. Lytes/cr today.  2) HTN: The current medical regimen is effective;  continue present plan and medications. She has a significant white coat component-->reassured pt today, no changes in med needed. Lytes/cr today.  3) HLD: tolerating statin.  Doing fairly well with diet/exercise. Due for FLP and AST/ALT today.  4) Health maintenance/preventative health: HepC? Dexa? Shingirx?--she declines all of these tests/vaccines. She is a Location manager.  An After Visit Summary was printed and given to the patient.  FOLLOW UP: Return in about 6 months (around 07/17/2019) for routine chronic illness f/u.  Signed:  Crissie Sickles, MD           01/16/2019

## 2019-02-03 ENCOUNTER — Other Ambulatory Visit: Payer: Self-pay | Admitting: Family Medicine

## 2019-04-03 ENCOUNTER — Other Ambulatory Visit: Payer: Self-pay

## 2019-04-03 MED ORDER — AMLODIPINE BESYLATE 5 MG PO TABS: 5.0000 mg | ORAL_TABLET | Freq: Every day | ORAL | 0 refills | Status: DC

## 2019-06-01 ENCOUNTER — Other Ambulatory Visit: Payer: Self-pay | Admitting: Family Medicine

## 2019-06-01 DIAGNOSIS — Z1231 Encounter for screening mammogram for malignant neoplasm of breast: Secondary | ICD-10-CM

## 2019-06-24 ENCOUNTER — Other Ambulatory Visit: Payer: Self-pay

## 2019-06-24 ENCOUNTER — Ambulatory Visit (INDEPENDENT_AMBULATORY_CARE_PROVIDER_SITE_OTHER): Payer: Medicare Other

## 2019-06-24 DIAGNOSIS — Z1231 Encounter for screening mammogram for malignant neoplasm of breast: Secondary | ICD-10-CM | POA: Diagnosis not present

## 2019-06-30 ENCOUNTER — Other Ambulatory Visit: Payer: Self-pay | Admitting: Family Medicine

## 2019-07-01 ENCOUNTER — Other Ambulatory Visit: Payer: Self-pay

## 2019-07-01 ENCOUNTER — Other Ambulatory Visit: Payer: Self-pay | Admitting: Family Medicine

## 2019-07-01 ENCOUNTER — Telehealth: Payer: Self-pay | Admitting: Family Medicine

## 2019-07-01 MED ORDER — ATORVASTATIN CALCIUM 20 MG PO TABS: ORAL_TABLET | ORAL | 0 refills | Status: DC

## 2019-07-01 MED ORDER — AMLODIPINE BESYLATE 5 MG PO TABS: 5.0000 mg | ORAL_TABLET | Freq: Every day | ORAL | 0 refills | Status: DC

## 2019-07-01 NOTE — Telephone Encounter (Signed)
Refills sent to last until follow up appt on 07/20/19. Pt was notified.

## 2019-07-01 NOTE — Telephone Encounter (Signed)
Refill request  She is scheduled to see Dr. Anitra Lauth on 8/17 She will not have enough meds to last until her appt  amLODipine (NORVASC) 5 MG tablet [[id number]]   atorvastatin (LIPITOR) 20 MG tablet [[id number]   [facility]

## 2019-07-04 ENCOUNTER — Other Ambulatory Visit: Payer: Self-pay | Admitting: Family Medicine

## 2019-07-17 ENCOUNTER — Ambulatory Visit: Payer: Medicare Other | Admitting: Family Medicine

## 2019-07-20 ENCOUNTER — Encounter: Payer: Self-pay | Admitting: Family Medicine

## 2019-07-20 ENCOUNTER — Ambulatory Visit (INDEPENDENT_AMBULATORY_CARE_PROVIDER_SITE_OTHER): Payer: Medicare Other | Admitting: Family Medicine

## 2019-07-20 ENCOUNTER — Other Ambulatory Visit: Payer: Self-pay

## 2019-07-20 VITALS — BP 137/76 | HR 82 | Temp 98.2°F | Resp 17 | Ht 63.5 in | Wt 205.2 lb

## 2019-07-20 DIAGNOSIS — F418 Other specified anxiety disorders: Secondary | ICD-10-CM

## 2019-07-20 DIAGNOSIS — I10 Essential (primary) hypertension: Secondary | ICD-10-CM

## 2019-07-20 DIAGNOSIS — E669 Obesity, unspecified: Secondary | ICD-10-CM

## 2019-07-20 DIAGNOSIS — E119 Type 2 diabetes mellitus without complications: Secondary | ICD-10-CM

## 2019-07-20 DIAGNOSIS — E7800 Pure hypercholesterolemia, unspecified: Secondary | ICD-10-CM

## 2019-07-20 DIAGNOSIS — Z0000 Encounter for general adult medical examination without abnormal findings: Secondary | ICD-10-CM

## 2019-07-20 LAB — BASIC METABOLIC PANEL
BUN: 15 mg/dL (ref 6–23)
CO2: 23 mEq/L (ref 19–32)
Calcium: 10.2 mg/dL (ref 8.4–10.5)
Chloride: 102 mEq/L (ref 96–112)
Creatinine, Ser: 0.74 mg/dL (ref 0.40–1.20)
GFR: 76.67 mL/min (ref >60.00)
Glucose, Bld: 139 mg/dL — ABNORMAL HIGH (ref 70–99)
Potassium: 4.1 mEq/L (ref 3.5–5.1)
Sodium: 138 mEq/L (ref 135–145)

## 2019-07-20 LAB — HEMOGLOBIN A1C: Hgb A1c MFr Bld: 6.9 % — ABNORMAL HIGH (ref 4.6–6.5)

## 2019-07-20 MED ORDER — AMLODIPINE BESYLATE 5 MG PO TABS: 5.0000 mg | ORAL_TABLET | Freq: Every day | ORAL | 3 refills | Status: DC

## 2019-07-20 MED ORDER — LISINOPRIL 20 MG PO TABS: 20.0000 mg | ORAL_TABLET | Freq: Every day | ORAL | 3 refills | Status: DC

## 2019-07-20 MED ORDER — ATORVASTATIN CALCIUM 20 MG PO TABS: ORAL_TABLET | ORAL | 11 refills | Status: DC

## 2019-07-20 NOTE — Progress Notes (Signed)
OFFICE VISIT  07/20/2019   CC:  Chief Complaint  Patient presents with  . Follow-up    RCI. does not check sugar everyday. took bp medication before she came in. bp readings have been great at home. may need refills   HPI:    Patient is a 73 y.o.  female who presents for 6 mo f/u HTN, HLD, diet controlled DM. Not exercising.  Staying at home due to covid pandemic.    She has situational anxiety (see pmh section below) for which she takes occ dose of xanax and this helps well w/out adverse side effects.  HTN:  Has signif white coat component-->very stressed/anxious when she has to come to MD office. Typical home bp 120s/70s-80s.  DM: no home glucose monitoring.  Eats fairly good diabetic diet.  HLD:  Taking statin w/out problem.  ROS: no CP, no SOB, no wheezing, no cough, no dizziness, no HAs, no rashes, no melena/hematochezia.  No polyuria or polydipsia.  No myalgias or arthralgias.   Past Medical History:  Diagnosis Date  . Arthritis   . Diabetes mellitus type II    Highest A1c 6.6% in 2014.  Diet-controlled.  . Diverticulosis   . History of cardiac catheterization 1999   normal  . Hyperlipidemia   . Hypertension   . Situational anxiety    Flying, heights, dentists: xanax prn works well  . Tubular adenoma of colon 2011   At 09/19/16 GI visit she decided against repeat colonoscopy, and decided to pursue annual iFOB testing instead (Dr. Hilarie Fredrickson) (iFOB neg 09/2016)    Past Surgical History:  Procedure Laterality Date  . CHOLECYSTECTOMY  1997   for cholelithiasis  . COLONOSCOPY  05/31/2010   Descending colon and sigmoid diverticulosis-severe.  Polypectomy x 1.  Recall 5 yrs (Dr. Sharlett Iles).  Pt chose not to get repeat colonoscopy due to bad experience with procedure in 2011.  She will do annal iFOB testing---has agreed to do colonoscopy IF one returns positive.  . TONSILLECTOMY      Outpatient Medications Prior to Visit  Medication Sig Dispense Refill  . Calcium  Carb-Cholecalciferol (CALCIUM-VITAMIN D) 600-400 MG-UNIT TABS Take 1 tablet by mouth 2 (two) times daily.    . Omega-3 Fatty Acids (FISH OIL) 1000 MG CAPS Take by mouth daily.      Marland Kitchen amLODipine (NORVASC) 5 MG tablet Take 1 tablet (5 mg total) by mouth daily. 30 tablet 0  . aspirin 81 MG tablet Take 81 mg by mouth daily.    Marland Kitchen atorvastatin (LIPITOR) 20 MG tablet TAKE 1 TABLET BY MOUTH EVERY DAY 90 tablet 2  . lisinopril (PRINIVIL,ZESTRIL) 20 MG tablet TAKE 1 TABLET BY MOUTH EVERY DAY 90 tablet 1   No facility-administered medications prior to visit.     No Known Allergies  ROS As per HPI  PE: Blood pressure 137/76, pulse 82, temperature 98.2 F (36.8 C), temperature source Temporal, resp. rate 17, height 5' 3.5" (1.613 m), weight 205 lb 4 oz (93.1 kg). Gen: Alert, well appearing.  Patient is oriented to person, place, time, and situation. AFFECT: pleasant, lucid thought and speech. No further exam today.  LABS:  Lab Results  Component Value Date   TSH 1.61 01/11/2016   Lab Results  Component Value Date   WBC 6.6 01/11/2016   HGB 14.5 01/11/2016   HCT 44.7 01/11/2016   MCV 81.1 01/11/2016   PLT 136.0 (L) 01/11/2016   Lab Results  Component Value Date   CREATININE 0.77 01/16/2019  BUN 11 01/16/2019   NA 139 01/16/2019   K 4.3 01/16/2019   CL 103 01/16/2019   CO2 26 01/16/2019   Lab Results  Component Value Date   ALT 28 01/16/2019   AST 22 01/16/2019   ALKPHOS 76 01/16/2019   BILITOT 0.7 01/16/2019   Lab Results  Component Value Date   CHOL 157 01/16/2019   Lab Results  Component Value Date   HDL 46.80 01/16/2019   Lab Results  Component Value Date   LDLCALC 89 01/16/2019   Lab Results  Component Value Date   TRIG 110.0 01/16/2019   Lab Results  Component Value Date   CHOLHDL 3 01/16/2019   Lab Results  Component Value Date   HGBA1C 6.7 (H) 01/16/2019    IMPRESSION AND PLAN:  1) DM 2, diet controlled. BMET and A1c today.  2) HTN: The  current medical regimen is effective;  continue present plan and medications. BMET today.  3) HLD: tolerating statin.  Lipids excellent 6 mo ago. Repeat FLP 6 mo.  4) Preventative health: discussed lack of good evidence for use of ASA for primary CV dz prevention-->chose to Stop ASA today.  She continues to decline all screening (mammogram, DEXA, Hep C, pap smears, colonoscopy).  5) Situational anxiety: she uses xanax VERY sparingly.  No new rx given today.  An After Visit Summary was printed and given to the patient.  FOLLOW UP: Return in about 6 months (around 01/20/2020) for routine chronic illness f/u.  Signed:  Crissie Sickles, MD           07/20/2019

## 2019-10-26 ENCOUNTER — Other Ambulatory Visit: Payer: Self-pay

## 2020-01-22 ENCOUNTER — Ambulatory Visit (INDEPENDENT_AMBULATORY_CARE_PROVIDER_SITE_OTHER): Payer: Medicare Other | Admitting: Family Medicine

## 2020-01-22 ENCOUNTER — Other Ambulatory Visit: Payer: Self-pay

## 2020-01-22 ENCOUNTER — Encounter: Payer: Self-pay | Admitting: Family Medicine

## 2020-01-22 VITALS — BP 120/82 | HR 71 | Temp 98.0°F | Resp 16 | Ht 63.5 in | Wt 203.0 lb

## 2020-01-22 DIAGNOSIS — F418 Other specified anxiety disorders: Secondary | ICD-10-CM

## 2020-01-22 DIAGNOSIS — I10 Essential (primary) hypertension: Secondary | ICD-10-CM

## 2020-01-22 DIAGNOSIS — Z1211 Encounter for screening for malignant neoplasm of colon: Secondary | ICD-10-CM | POA: Diagnosis not present

## 2020-01-22 DIAGNOSIS — E7800 Pure hypercholesterolemia, unspecified: Secondary | ICD-10-CM | POA: Diagnosis not present

## 2020-01-22 DIAGNOSIS — E119 Type 2 diabetes mellitus without complications: Secondary | ICD-10-CM

## 2020-01-22 LAB — LIPID PANEL
Cholesterol: 179 mg/dL (ref 0–200)
HDL: 44.2 mg/dL (ref >39.00)
LDL Cholesterol: 102 mg/dL — ABNORMAL HIGH (ref 0–99)
NonHDL: 134.8
Total CHOL/HDL Ratio: 4
Triglycerides: 166 mg/dL — ABNORMAL HIGH (ref 0.0–149.0)
VLDL: 33.2 mg/dL (ref 0.0–40.0)

## 2020-01-22 LAB — COMPREHENSIVE METABOLIC PANEL
ALT: 22 U/L (ref 0–35)
AST: 19 U/L (ref 0–37)
Albumin: 4.6 g/dL (ref 3.5–5.2)
Alkaline Phosphatase: 78 U/L (ref 39–117)
BUN: 14 mg/dL (ref 6–23)
CO2: 26 mEq/L (ref 19–32)
Calcium: 10.3 mg/dL (ref 8.4–10.5)
Chloride: 102 mEq/L (ref 96–112)
Creatinine, Ser: 0.71 mg/dL (ref 0.40–1.20)
GFR: 80.31 mL/min (ref >60.00)
Glucose, Bld: 136 mg/dL — ABNORMAL HIGH (ref 70–99)
Potassium: 4.4 mEq/L (ref 3.5–5.1)
Sodium: 138 mEq/L (ref 135–145)
Total Bilirubin: 0.8 mg/dL (ref 0.2–1.2)
Total Protein: 7.3 g/dL (ref 6.0–8.3)

## 2020-01-22 LAB — HEMOGLOBIN A1C: Hgb A1c MFr Bld: 6.9 % — ABNORMAL HIGH (ref 4.6–6.5)

## 2020-01-22 NOTE — Progress Notes (Signed)
OFFICE VISIT  01/22/2020   CC:  Chief Complaint  Patient presents with  . Follow-up    RCI, pt is fasting   HPI:    Patient is a 74 y.o. Caucasian female who presents for 6 mo f/u diet controlled DM, HTN, HLD, and situational anxiety. A/P as of last visit: "1) DM 2, diet controlled. BMET and A1c today. Feet: no burning, tingling, or numbness.  2) HTN: The current medical regimen is effective;  continue present plan and medications. BMET today.  3) HLD: tolerating statin.  Lipids excellent 6 mo ago. Repeat FLP 6 mo.  4) Preventative health: discussed lack of good evidence for use of ASA for primary CV dz prevention-->chose to Stop ASA today.  She continues to decline all screening (mammogram, DEXA, Hep C, pap smears, colonoscopy).  5) Situational anxiety: she uses xanax VERY sparingly.  No new rx given today."  INTERIM HX:  Feeling well.  DM:  No formal exercise.  Not eating very well lately. No home glucose monitoring.  HTN: Home bp monitoring consistently <130/80.  HLD: tolerating statin well.  Diet not good lately, eating out a lot.  ROS: no fevers, no CP, no SOB, no wheezing, no cough, no dizziness, no HAs, no rashes, no melena/hematochezia.  No polyuria or polydipsia.  No myalgias or arthralgias.    Past Medical History:  Diagnosis Date  . Arthritis   . Diabetes mellitus type II    Highest A1c 6.6% in 2014.  Diet-controlled.  . Diverticulosis   . History of cardiac catheterization 1999   normal  . Hyperlipidemia   . Hypertension   . Obesity, Class II, BMI 35-39.9   . Situational anxiety    Flying, heights, dentists: xanax prn works well  . Tubular adenoma of colon 2011   At 09/19/16 GI visit she decided against repeat colonoscopy, and decided to pursue annual iFOB testing instead (Dr. Hilarie Fredrickson) (iFOB neg 09/2016)    Past Surgical History:  Procedure Laterality Date  . CHOLECYSTECTOMY  1997   for cholelithiasis  . COLONOSCOPY  05/31/2010   Descending colon and sigmoid diverticulosis-severe.  Polypectomy x 1.  Recall 5 yrs (Dr. Sharlett Iles).  Pt chose not to get repeat colonoscopy due to bad experience with procedure in 2011.  She will do annal iFOB testing---has agreed to do colonoscopy IF one returns positive.  . TONSILLECTOMY      Outpatient Medications Prior to Visit  Medication Sig Dispense Refill  . amLODipine (NORVASC) 5 MG tablet Take 1 tablet (5 mg total) by mouth daily. 90 tablet 3  . atorvastatin (LIPITOR) 20 MG tablet TAKE 1 TABLET BY MOUTH EVERY DAY 30 tablet 11  . Calcium Carb-Cholecalciferol (CALCIUM-VITAMIN D) 600-400 MG-UNIT TABS Take 1 tablet by mouth 2 (two) times daily.    Marland Kitchen lisinopril (ZESTRIL) 20 MG tablet Take 1 tablet (20 mg total) by mouth daily. 90 tablet 3  . Omega-3 Fatty Acids (FISH OIL) 1000 MG CAPS Take by mouth daily.       No facility-administered medications prior to visit.    No Known Allergies  ROS As per HPI  PE: BP repeat manually was 120/82 Blood pressure 120/82, pulse 71, temperature 98 F (36.7 C), temperature source Temporal, resp. rate 16, height 5' 3.5" (1.613 m), weight 203 lb (92.1 kg), SpO2 95 %. Body mass index is 35.4 kg/m.  Gen: Alert, well appearing.  Patient is oriented to person, place, time, and situation. AFFECT: pleasant, lucid thought and speech. CV: RRR, no m/r/g.  LUNGS: CTA bilat, nonlabored resps, good aeration in all lung fields. EXT: no clubbing or cyanosis.  no edema.  Foot exam --no swelling, tenderness or skin or vascular lesions. Color and temperature is normal. Sensation is intact. Peripheral pulses are palpable. Toenails are normal.   LABS:  Lab Results  Component Value Date   TSH 1.61 01/11/2016   Lab Results  Component Value Date   WBC 6.6 01/11/2016   HGB 14.5 01/11/2016   HCT 44.7 01/11/2016   MCV 81.1 01/11/2016   PLT 136.0 (L) 01/11/2016   Lab Results  Component Value Date   CREATININE 0.74 07/20/2019   BUN 15 07/20/2019   NA 138  07/20/2019   K 4.1 07/20/2019   CL 102 07/20/2019   CO2 23 07/20/2019   Lab Results  Component Value Date   ALT 28 01/16/2019   AST 22 01/16/2019   ALKPHOS 76 01/16/2019   BILITOT 0.7 01/16/2019   Lab Results  Component Value Date   CHOL 157 01/16/2019   Lab Results  Component Value Date   HDL 46.80 01/16/2019   Lab Results  Component Value Date   LDLCALC 89 01/16/2019   Lab Results  Component Value Date   TRIG 110.0 01/16/2019   Lab Results  Component Value Date   CHOLHDL 3 01/16/2019   Lab Results  Component Value Date   HGBA1C 6.9 (H) 07/20/2019   IMPRESSION AND PLAN:  1) DM, diet controlled. Feet exam normal today. HbA1c and lytes/cr today. Diet and activity-->needs improvement.  2) HTN: The current medical regimen is effective;  continue present plan and medications. BMET today.  3) HLD: tolerating statin. Diet and activity-->needs improvement.  4) Preventative health: She continues to decline all screening (mammogram, DEXA, Hep C, pap smears, colon ca screening).  An After Visit Summary was printed and given to the patient.  FOLLOW UP: Return in about 6 months (around 07/21/2020) for routine chronic illness f/u.  Signed:  Crissie Sickles, MD           01/22/2020

## 2020-01-31 ENCOUNTER — Ambulatory Visit: Payer: Medicare Other | Attending: Internal Medicine

## 2020-01-31 DIAGNOSIS — Z23 Encounter for immunization: Secondary | ICD-10-CM | POA: Insufficient documentation

## 2020-01-31 NOTE — Progress Notes (Signed)
   Covid-19 Vaccination Clinic  Name:  Catherine Daniel    MRN: [id number] DOB: August 19, 1946  01/31/2020  Ms. Rupard was observed post Covid-19 immunization for 15 minutes without incidence. She was provided with Vaccine Information Sheet and instruction to access the V-Safe system.   Ms. Oflaherty was instructed to call 911 with any severe reactions post vaccine: Marland Kitchen Difficulty breathing  . Swelling of your face and throat  . A fast heartbeat  . A bad rash all over your body  . Dizziness and weakness    Immunizations Administered    Name Date Dose VIS Date Route   Pfizer COVID-19 Vaccine 01/31/2020  3:46 PM 0.3 mL 11/13/2019 Intramuscular   Manufacturer: [place]   Lot: [id number]   [place]: [id number]

## 2020-03-01 ENCOUNTER — Ambulatory Visit: Payer: Medicare Other | Attending: Internal Medicine

## 2020-03-01 DIAGNOSIS — Z23 Encounter for immunization: Secondary | ICD-10-CM

## 2020-03-01 NOTE — Progress Notes (Signed)
   Covid-19 Vaccination Clinic  Name:  Catherine Daniel    MRN: [id number] DOB: 03-Jan-1946  03/01/2020  [name] was observed post Covid-19 immunization for 15 minutes without incident. She was provided with Vaccine Information Sheet and instruction to access the V-Safe system.   [name] was instructed to call 911 with any severe reactions post vaccine: Marland Kitchen Difficulty breathing  . Swelling of face and throat  . A fast heartbeat  . A bad rash all over body  . Dizziness and weakness   Immunizations Administered    Name Date Dose VIS Date Route   Pfizer COVID-19 Vaccine 03/01/2020  2:55 PM 0.3 mL 11/13/2019 Intramuscular   Manufacturer: [place]   Lot: [id number]   [place]: [id number]

## 2020-07-04 ENCOUNTER — Other Ambulatory Visit: Payer: Self-pay | Admitting: Family Medicine

## 2020-07-04 DIAGNOSIS — Z1231 Encounter for screening mammogram for malignant neoplasm of breast: Secondary | ICD-10-CM

## 2020-07-06 ENCOUNTER — Ambulatory Visit (INDEPENDENT_AMBULATORY_CARE_PROVIDER_SITE_OTHER): Payer: Medicare Other

## 2020-07-06 ENCOUNTER — Other Ambulatory Visit: Payer: Self-pay

## 2020-07-06 DIAGNOSIS — Z1231 Encounter for screening mammogram for malignant neoplasm of breast: Secondary | ICD-10-CM | POA: Diagnosis not present

## 2020-07-24 ENCOUNTER — Other Ambulatory Visit: Payer: Self-pay | Admitting: Family Medicine

## 2020-08-05 ENCOUNTER — Other Ambulatory Visit: Payer: Self-pay

## 2020-08-05 ENCOUNTER — Ambulatory Visit (INDEPENDENT_AMBULATORY_CARE_PROVIDER_SITE_OTHER): Payer: Medicare Other | Admitting: Family Medicine

## 2020-08-05 ENCOUNTER — Encounter: Payer: Self-pay | Admitting: Family Medicine

## 2020-08-05 VITALS — BP 178/91 | HR 57 | Temp 97.4°F | Resp 16 | Ht 63.5 in | Wt 205.0 lb

## 2020-08-05 DIAGNOSIS — E119 Type 2 diabetes mellitus without complications: Secondary | ICD-10-CM | POA: Diagnosis not present

## 2020-08-05 DIAGNOSIS — E7800 Pure hypercholesterolemia, unspecified: Secondary | ICD-10-CM

## 2020-08-05 DIAGNOSIS — I10 Essential (primary) hypertension: Secondary | ICD-10-CM

## 2020-08-05 LAB — BASIC METABOLIC PANEL
BUN: 15 mg/dL (ref 6–23)
CO2: 24 mEq/L (ref 19–32)
Calcium: 10.2 mg/dL (ref 8.4–10.5)
Chloride: 103 mEq/L (ref 96–112)
Creatinine, Ser: 0.81 mg/dL (ref 0.40–1.20)
GFR: 68.88 mL/min (ref >60.00)
Glucose, Bld: 136 mg/dL — ABNORMAL HIGH (ref 70–99)
Potassium: 4.1 mEq/L (ref 3.5–5.1)
Sodium: 137 mEq/L (ref 135–145)

## 2020-08-05 LAB — LIPID PANEL
Cholesterol: 153 mg/dL (ref 0–200)
HDL: 39.8 mg/dL (ref >39.00)
LDL Cholesterol: 83 mg/dL (ref 0–99)
NonHDL: 113.12
Total CHOL/HDL Ratio: 4
Triglycerides: 153 mg/dL — ABNORMAL HIGH (ref 0.0–149.0)
VLDL: 30.6 mg/dL (ref 0.0–40.0)

## 2020-08-05 LAB — HEMOGLOBIN A1C: Hgb A1c MFr Bld: 7.1 % — ABNORMAL HIGH (ref 4.6–6.5)

## 2020-08-05 MED ORDER — AMLODIPINE BESYLATE 5 MG PO TABS
5.0000 mg | ORAL_TABLET | Freq: Every day | ORAL | 3 refills | Status: DC
Start: 2020-08-05 — End: 2021-08-25

## 2020-08-05 MED ORDER — LISINOPRIL 20 MG PO TABS
20.0000 mg | ORAL_TABLET | Freq: Every day | ORAL | 3 refills | Status: DC
Start: 2020-08-05 — End: 2021-08-25

## 2020-08-05 MED ORDER — ATORVASTATIN CALCIUM 20 MG PO TABS
20.0000 mg | ORAL_TABLET | Freq: Every day | ORAL | 3 refills | Status: DC
Start: 2020-08-05 — End: 2021-08-25

## 2020-08-05 NOTE — Progress Notes (Signed)
OFFICE VISIT  08/05/2020  CC:  Chief Complaint  Patient presents with  . Follow-up    RCI, pt is fasting   HPI:    Patient is a 74 y.o. Caucasian female who presents for 6 mo f/u diet controlled DM 2, HTN, HLD, and situational anxiety. A/P as of last visit: " DM, diet controlled. Feet exam normal today. HbA1c and lytes/cr today. Diet and activity-->needs improvement.  2) HTN: The current medical regimen is effective;  continue present plan and medications. BMET today.  3) HLD: tolerating statin. Diet and activity-->needs improvement.  4) Preventative health: She continues to decline all screening (mammogram, DEXA, Hep C, pap smears, colon ca screening)."  INTERIM HX: Last f/u visit all was stable, including labs.  No changes were made. Feeling well.  Getting out and about some now that she has had covid. No formal exercise. Eating more restaurant food lately.  Otherwise pretty sensible.  HTN: this morning bp was 109/67, P 64.  A couple hours later 115/79, P 84. Typical pulse around 60.    DM: no glucose checks at home.  HLD: tolerating this well.  Historically she has used xanax VERY sparingly for periods of markedly increased anxiety--usually only when she flies.    ROS: no fevers, no CP, no SOB, no wheezing, no cough, no dizziness, no HAs, no rashes, no melena/hematochezia.  No polyuria or polydipsia.  No myalgias or arthralgias.  No focal weakness, paresthesias, or tremors.  No acute vision or hearing abnormalities. No n/v/d or abd pain.  No palpitations.     Past Medical History:  Diagnosis Date  . Arthritis   . Diabetes mellitus type II    Highest A1c 6.6% in 2014.  Diet-controlled.  . Diverticulosis   . History of cardiac catheterization 1999   normal  . Hyperlipidemia   . Hypertension   . Obesity, Class II, BMI 35-39.9   . Situational anxiety    Flying, heights, dentists: xanax prn works well  . Tubular adenoma of colon 2011   At 09/19/16 GI visit  she decided against repeat colonoscopy, and decided to pursue annual iFOB testing instead (Dr. Hilarie Fredrickson) (iFOB neg 09/2016)    Past Surgical History:  Procedure Laterality Date  . CHOLECYSTECTOMY  1997   for cholelithiasis  . COLONOSCOPY  05/31/2010   Descending colon and sigmoid diverticulosis-severe.  Polypectomy x 1.  Recall 5 yrs (Dr. Sharlett Iles).  Pt chose not to get repeat colonoscopy due to bad experience with procedure in 2011.  She will do annal iFOB testing---has agreed to do colonoscopy IF one returns positive.  . TONSILLECTOMY      Outpatient Medications Prior to Visit  Medication Sig Dispense Refill  . Calcium Carb-Cholecalciferol (CALCIUM-VITAMIN D) 600-400 MG-UNIT TABS Take 1 tablet by mouth 2 (two) times daily.    . Omega-3 Fatty Acids (FISH OIL) 1000 MG CAPS Take by mouth daily.      Marland Kitchen amLODipine (NORVASC) 5 MG tablet TAKE 1 TABLET BY MOUTH EVERY DAY 30 tablet 0  . atorvastatin (LIPITOR) 20 MG tablet TAKE 1 TABLET BY MOUTH EVERY DAY 30 tablet 0  . lisinopril (ZESTRIL) 20 MG tablet TAKE 1 TABLET BY MOUTH EVERY DAY 30 tablet 0   No facility-administered medications prior to visit.    No Known Allergies  ROS As per HPI  PE: Vitals with BMI 08/05/2020 01/22/2020 01/22/2020  Height 5' 3.5" - 5' 3.5"  Weight 205 lbs - 203 lbs  BMI 51.02 - 58.52  Systolic 778  528 413  Diastolic 91 82 85  Pulse 57 - 71     Gen: Alert, well appearing.  Patient is oriented to person, place, time, and situation. AFFECT: pleasant, lucid thought and speech. CV: RRR, no m/r/g.   LUNGS: CTA bilat, nonlabored resps, good aeration in all lung fields. EXT: no clubbing or cyanosis.  no edema.    LABS:  Lab Results  Component Value Date   TSH 1.61 01/11/2016   Lab Results  Component Value Date   WBC 6.6 01/11/2016   HGB 14.5 01/11/2016   HCT 44.7 01/11/2016   MCV 81.1 01/11/2016   PLT 136.0 (L) 01/11/2016   Lab Results  Component Value Date   CREATININE 0.71 01/22/2020   BUN 14  01/22/2020   NA 138 01/22/2020   K 4.4 01/22/2020   CL 102 01/22/2020   CO2 26 01/22/2020   Lab Results  Component Value Date   ALT 22 01/22/2020   AST 19 01/22/2020   ALKPHOS 78 01/22/2020   BILITOT 0.8 01/22/2020   Lab Results  Component Value Date   CHOL 179 01/22/2020   Lab Results  Component Value Date   HDL 44.20 01/22/2020   Lab Results  Component Value Date   LDLCALC 102 (H) 01/22/2020   Lab Results  Component Value Date   TRIG 166.0 (H) 01/22/2020   Lab Results  Component Value Date   CHOLHDL 4 01/22/2020   Lab Results  Component Value Date   HGBA1C 6.9 (H) 01/22/2020    IMPRESSION AND PLAN:  1) DM 2, diet controlled. Working on diet.  Needs to focus more on CV exercise and wt loss. Fasting gluc and Hba1c and lytes/cr today.  2) HTN: consistently normal at home.  Up today, but recheck manually was improved to 148/82. No changes--cont amlod 5 qd and lisin 20 qd. BMET today.  3) HLD: tolerating 20mg  qd atorvastatin. FLP today.  Goal LDL 70.  Hepatic panel normal 01/2020.  An After Visit Summary was printed and given to the patient.  FOLLOW UP: Return in about 6 months (around 02/02/2021) for routine chronic illness f/u.  Signed:  Crissie Sickles, MD           08/05/2020

## 2020-09-13 ENCOUNTER — Telehealth: Payer: Self-pay | Admitting: Family Medicine

## 2020-09-13 NOTE — Telephone Encounter (Signed)
[name] declined AWV for his spouse for this year

## 2020-10-04 DIAGNOSIS — Z23 Encounter for immunization: Secondary | ICD-10-CM | POA: Diagnosis not present

## 2021-03-07 ENCOUNTER — Encounter: Payer: Medicare Other | Admitting: Family Medicine

## 2021-06-22 ENCOUNTER — Other Ambulatory Visit: Payer: Self-pay

## 2021-06-22 ENCOUNTER — Ambulatory Visit (INDEPENDENT_AMBULATORY_CARE_PROVIDER_SITE_OTHER): Payer: Medicare Other | Admitting: Family Medicine

## 2021-06-22 ENCOUNTER — Encounter: Payer: Self-pay | Admitting: Family Medicine

## 2021-06-22 VITALS — BP 138/79 | HR 74 | Temp 98.2°F | Ht 63.5 in | Wt 201.0 lb

## 2021-06-22 DIAGNOSIS — E7800 Pure hypercholesterolemia, unspecified: Secondary | ICD-10-CM | POA: Diagnosis not present

## 2021-06-22 DIAGNOSIS — I10 Essential (primary) hypertension: Secondary | ICD-10-CM | POA: Diagnosis not present

## 2021-06-22 DIAGNOSIS — E119 Type 2 diabetes mellitus without complications: Secondary | ICD-10-CM

## 2021-06-22 LAB — LIPID PANEL
Cholesterol: 160 mg/dL (ref 0–200)
HDL: 46.7 mg/dL (ref >39.00)
LDL Cholesterol: 89 mg/dL (ref 0–99)
NonHDL: 113
Total CHOL/HDL Ratio: 3
Triglycerides: 121 mg/dL (ref 0.0–149.0)
VLDL: 24.2 mg/dL (ref 0.0–40.0)

## 2021-06-22 LAB — COMPREHENSIVE METABOLIC PANEL
ALT: 21 U/L (ref 0–35)
AST: 21 U/L (ref 0–37)
Albumin: 4.5 g/dL (ref 3.5–5.2)
Alkaline Phosphatase: 73 U/L (ref 39–117)
BUN: 13 mg/dL (ref 6–23)
CO2: 25 mEq/L (ref 19–32)
Calcium: 10.5 mg/dL (ref 8.4–10.5)
Chloride: 102 mEq/L (ref 96–112)
Creatinine, Ser: 0.75 mg/dL (ref 0.40–1.20)
GFR: 77.48 mL/min (ref >60.00)
Glucose, Bld: 127 mg/dL — ABNORMAL HIGH (ref 70–99)
Potassium: 4.3 mEq/L (ref 3.5–5.1)
Sodium: 139 mEq/L (ref 135–145)
Total Bilirubin: 0.9 mg/dL (ref 0.2–1.2)
Total Protein: 7 g/dL (ref 6.0–8.3)

## 2021-06-22 LAB — HEMOGLOBIN A1C: Hgb A1c MFr Bld: 6.9 % — ABNORMAL HIGH (ref 4.6–6.5)

## 2021-06-22 NOTE — Progress Notes (Signed)
OFFICE VISIT  06/22/2021  CC:  Chief Complaint  Patient presents with   Annual Exam    Pt is fasting   HPI:    Patient is a 75 y.o. female who presents for 10 month f/u DM 2, HTN,HLD and situational anxiety (high risk med use). A/P as of last visit: "1) DM 2, diet controlled. Working on diet.  Needs to focus more on CV exercise and wt loss. Fasting gluc and Hba1c and lytes/cr today.   2) HTN: consistently normal at home.  Up today, but recheck manually was improved to 148/82. No changes--cont amlod 5 qd and lisin 20 qd. BMET today.   3) HLD: tolerating '20mg'$  qd atorvastatin. FLP today.  Goal LDL 70.  Hepatic panel normal 01/2020."  INTERIM HX: She feels well.  HOME BPs: avg 130/70s. No added salt.  HLD: tolerating atorva 20 qd.  Trying to eat healthy overall.  She has historically used xanax prn airplane flights and this has helped significantly. PMP AWARE reviewed today: no meds/rx's listed. No red flags.  ROS as above, plus--> no fevers, no CP, no SOB, no wheezing, no cough, no dizziness, no HAs, no rashes, no melena/hematochezia.  No polyuria or polydipsia.  No myalgias or arthralgias.  No focal weakness, paresthesias, or tremors.  No acute vision or hearing abnormalities.  No dysuria or unusual/new urinary urgency or frequency.  No recent changes in lower legs. No n/v/d or abd pain.  No palpitations.     Past Medical History:  Diagnosis Date   Arthritis    Diabetes mellitus type II    Highest A1c 6.6% in 2014.  Diet-controlled.   Diverticulosis    History of cardiac catheterization 1999   normal   Hyperlipidemia    Hypertension    Obesity, Class II, BMI 35-39.9    Situational anxiety    Flying, heights, dentists: xanax prn works well   Tubular adenoma of colon 2011   At 09/19/16 GI visit she decided against repeat colonoscopy, and decided to pursue annual iFOB testing instead (Dr. Hilarie Fredrickson) (iFOB neg 09/2016)    Past Surgical History:  Procedure Laterality  Date   CHOLECYSTECTOMY  1997   for cholelithiasis   COLONOSCOPY  05/31/2010   Descending colon and sigmoid diverticulosis-severe.  Polypectomy x 1.  Recall 5 yrs (Dr. Sharlett Iles).  Pt chose not to get repeat colonoscopy due to bad experience with procedure in 2011.  She will do annal iFOB testing---has agreed to do colonoscopy IF one returns positive.   TONSILLECTOMY      Outpatient Medications Prior to Visit  Medication Sig Dispense Refill   amLODipine (NORVASC) 5 MG tablet Take 1 tablet (5 mg total) by mouth daily. 90 tablet 3   atorvastatin (LIPITOR) 20 MG tablet Take 1 tablet (20 mg total) by mouth daily. 90 tablet 3   Calcium Carb-Cholecalciferol (CALCIUM-VITAMIN D) 600-400 MG-UNIT TABS Take 1 tablet by mouth 2 (two) times daily.     lisinopril (ZESTRIL) 20 MG tablet Take 1 tablet (20 mg total) by mouth daily. 90 tablet 3   Omega-3 Fatty Acids (FISH OIL) 1000 MG CAPS Take by mouth daily.       No facility-administered medications prior to visit.    No Known Allergies  ROS As per HPI  PE: Vitals with BMI 06/22/2021 08/05/2020 01/22/2020  Height 5' 3.5" 5' 3.5" -  Weight 201 lbs 205 lbs -  BMI 123456 [id number] -  Systolic 0000000 0000000 123456  Diastolic 79 91 82  Pulse  74 57 -   Exam chaperoned by Kavin Leech, CMA. Gen: Alert, well appearing.  Patient is oriented to person, place, time, and situation. AFFECT: pleasant, lucid thought and speech. CV: RRR, no m/r/g.   LUNGS: CTA bilat, nonlabored resps, good aeration in all lung fields. ABD: soft, NT, ND, BS normal.  No hepatospenomegaly or mass.  No bruits. EXT: no clubbing or cyanosis.  no edema.  Foot exam - no swelling, tenderness or skin or vascular lesions. Color and temperature is normal. Sensation is intact. Peripheral pulses are palpable. Toenails are normal except both great toe nails hypertrophic and discolored.  LABS:  Lab Results  Component Value Date   TSH 1.61 01/11/2016   Lab Results  Component Value Date   WBC 6.6  01/11/2016   HGB 14.5 01/11/2016   HCT 44.7 01/11/2016   MCV 81.1 01/11/2016   PLT 136.0 (L) 01/11/2016   Lab Results  Component Value Date   CREATININE 0.81 08/05/2020   BUN 15 08/05/2020   NA 137 08/05/2020   K 4.1 08/05/2020   CL 103 08/05/2020   CO2 24 08/05/2020   Lab Results  Component Value Date   ALT 22 01/22/2020   AST 19 01/22/2020   ALKPHOS 78 01/22/2020   BILITOT 0.8 01/22/2020   Lab Results  Component Value Date   CHOL 153 08/05/2020   Lab Results  Component Value Date   HDL 39.80 08/05/2020   Lab Results  Component Value Date   LDLCALC 83 08/05/2020   Lab Results  Component Value Date   TRIG 153.0 (H) 08/05/2020   Lab Results  Component Value Date   CHOLHDL 4 08/05/2020   Lab Results  Component Value Date   HGBA1C 7.1 (H) 08/05/2020   IMPRESSION AND PLAN:  1) DM 2, diet controlled. Hba1ct today. Feet exam normal today. Lytes/cr today.  2) HTN, well controlled on lisin 20 qd and amlod 5 qd. Lytes/cr today.  3) HLD: atorva 20 qd long term. LDL 83 sept 2021. FLP and hepatic panel today.  4) Preventative health: She continues to decline all screening (mammogram, DEXA, Hep C, pap smears, colon ca screening).  An After Visit Summary was printed and given to the patient.  FOLLOW UP: Return in about 6 months (around 12/23/2021) for routine chronic illness f/u.  Signed:  Crissie Sickles, MD           06/22/2021

## 2021-07-14 ENCOUNTER — Other Ambulatory Visit: Payer: Self-pay | Admitting: Family Medicine

## 2021-07-14 DIAGNOSIS — Z1231 Encounter for screening mammogram for malignant neoplasm of breast: Secondary | ICD-10-CM

## 2021-07-19 ENCOUNTER — Ambulatory Visit (INDEPENDENT_AMBULATORY_CARE_PROVIDER_SITE_OTHER): Payer: Medicare Other

## 2021-07-19 ENCOUNTER — Other Ambulatory Visit: Payer: Self-pay

## 2021-07-19 DIAGNOSIS — Z1231 Encounter for screening mammogram for malignant neoplasm of breast: Secondary | ICD-10-CM | POA: Diagnosis not present

## 2021-08-21 ENCOUNTER — Telehealth: Payer: Self-pay

## 2021-08-21 NOTE — Telephone Encounter (Signed)
LVM for pt to CB to sched an AWV.

## 2021-08-25 ENCOUNTER — Other Ambulatory Visit: Payer: Self-pay | Admitting: Family Medicine

## 2021-09-02 DIAGNOSIS — U071 COVID-19: Secondary | ICD-10-CM

## 2021-09-02 HISTORY — DX: COVID-19: U07.1

## 2021-09-29 ENCOUNTER — Telehealth (INDEPENDENT_AMBULATORY_CARE_PROVIDER_SITE_OTHER): Payer: Medicare Other | Admitting: Family Medicine

## 2021-09-29 ENCOUNTER — Other Ambulatory Visit: Payer: Self-pay

## 2021-09-29 VITALS — Temp 99.8°F | Wt 201.0 lb

## 2021-09-29 DIAGNOSIS — J069 Acute upper respiratory infection, unspecified: Secondary | ICD-10-CM | POA: Diagnosis not present

## 2021-09-29 DIAGNOSIS — U071 COVID-19: Secondary | ICD-10-CM | POA: Diagnosis not present

## 2021-09-29 MED ORDER — NIRMATRELVIR/RITONAVIR (PAXLOVID)TABLET: 3.0000 | ORAL_TABLET | Freq: Two times a day (BID) | ORAL | 0 refills | Status: AC

## 2021-09-29 NOTE — Progress Notes (Signed)
Virtual Visit via Video Note  I connected with Catherine Daniel on 09/29/21 at  2:45 PM EDT by telephone b/c a video enabled telemedicine application was not available to the patient (no computer or smart phone).  I verified that I am speaking with the correct person using two identifiers.  Location patient: home, [place] Location provider:work or home office Persons participating in the virtual visit: patient, provider  I discussed the limitations of evaluation and management by telemedicine and the availability of in person appointments. The patient expressed understanding and agreed to proceed.  Telemedicine visit is a necessity given the COVID-19 restrictions in place at the current time.  HPI: 75 y/o female being seen today for respiratory symptoms, positive home covid test. Onset of fatigue 3 or 4 days ago, then onset of runny nose nasal congestion and coughing 2 nights ago.  T-max 99.8.  No body aches.  Some mild nausea initially but this has resolved.  No vomiting or diarrhea.  No shortness of breath, chest tightness, or chest pain.  No significant headache or sore throat.  ROS: See pertinent positives and negatives per HPI.  Past Medical History:  Diagnosis Date   Arthritis    Diabetes mellitus type II    Highest A1c 6.6% in 2014.  Diet-controlled.   Diverticulosis    History of cardiac catheterization 1999   normal   Hyperlipidemia    Hypertension    Obesity, Class II, BMI 35-39.9    Situational anxiety    Flying, heights, dentists: xanax prn works well   Tubular adenoma of colon 2011   At 09/19/16 GI visit she decided against repeat colonoscopy, and decided to pursue annual iFOB testing instead (Dr. Hilarie Fredrickson) (iFOB neg 09/2016)    Past Surgical History:  Procedure Laterality Date   CHOLECYSTECTOMY  1997   for cholelithiasis   COLONOSCOPY  05/31/2010   Descending colon and sigmoid diverticulosis-severe.  Polypectomy x 1.  Recall 5 yrs (Dr. Sharlett Iles).  Pt chose not to get repeat  colonoscopy due to bad experience with procedure in 2011.  She will do annal iFOB testing---has agreed to do colonoscopy IF one returns positive.   TONSILLECTOMY       Current Outpatient Medications:    amLODipine (NORVASC) 5 MG tablet, TAKE 1 TABLET BY MOUTH EVERY DAY, Disp: 90 tablet, Rfl: 1   atorvastatin (LIPITOR) 20 MG tablet, TAKE 1 TABLET BY MOUTH EVERY DAY, Disp: 90 tablet, Rfl: 1   Calcium Carb-Cholecalciferol (CALCIUM-VITAMIN D) 600-400 MG-UNIT TABS, Take 1 tablet by mouth 2 (two) times daily., Disp: , Rfl:    lisinopril (ZESTRIL) 20 MG tablet, TAKE 1 TABLET BY MOUTH EVERY DAY, Disp: 90 tablet, Rfl: 1   Omega-3 Fatty Acids (FISH OIL) 1000 MG CAPS, Take by mouth daily.  , Disp: , Rfl:   EXAM:  VITALS per patient if applicable:  Vitals with BMI 06/22/2021 08/05/2020 01/22/2020  Height 5' 3.5" 5' 3.5" -  Weight 201 lbs 205 lbs -  BMI 20.94 70.96 -  Systolic 283 662 947  Diastolic 79 91 82  Pulse 74 57 -     GENERAL: alert, oriented, sounds well and in no acute distress No further exam b/c audio visit only.   LABS: none today    Chemistry      Component Value Date/Time   NA 139 06/22/2021 1128   K 4.3 06/22/2021 1128   CL 102 06/22/2021 1128   CO2 25 06/22/2021 1128   BUN 13 06/22/2021 1128   CREATININE 0.75  06/22/2021 1128      Component Value Date/Time   CALCIUM 10.5 06/22/2021 1128   ALKPHOS 73 06/22/2021 1128   AST 21 06/22/2021 1128   ALT 21 06/22/2021 1128   BILITOT 0.9 06/22/2021 1128     ASSESSMENT AND PLAN:  Discussed the following assessment and plan:  COVID-19 respiratory infection. RF's for complications from COVID infection: age >48, DM 2, BMI 22. Patient is within the treatment window for antiviral.  GFR 77. Paxlovid prescribed today.  Advised pt to hold atorvastatin while taking paxlovid. Signs/symptoms to call or return for were reviewed and pt expressed understanding.  I discussed the assessment and treatment plan with the patient. The  patient was provided an opportunity to ask questions and all were answered. The patient agreed with the plan and demonstrated an understanding of the instructions.   Spent 12 min with pt today reviewing HPI, reviewing relevant past history, doing exam, reviewing and discussing lab and imaging data, and formulating plans.  F/u: if not signif imp in 3-5 d.  Signed:  Crissie Sickles, MD           09/29/2021

## 2021-11-15 ENCOUNTER — Telehealth: Payer: Self-pay

## 2021-11-15 NOTE — Telephone Encounter (Signed)
Called husband about his medication and he stated the pt's medication was fine now.

## 2021-11-15 NOTE — Telephone Encounter (Signed)
Patient husband calling about med not is stock at [facility]. Please see note on husband Frank's chart.   amLODipine (NORVASC) 5 MG tablet [[id number]]

## 2022-02-08 ENCOUNTER — Ambulatory Visit (INDEPENDENT_AMBULATORY_CARE_PROVIDER_SITE_OTHER): Payer: Medicare Other

## 2022-02-08 ENCOUNTER — Other Ambulatory Visit: Payer: Self-pay

## 2022-02-08 DIAGNOSIS — Z0000 Encounter for general adult medical examination without abnormal findings: Secondary | ICD-10-CM | POA: Diagnosis not present

## 2022-02-08 NOTE — Progress Notes (Addendum)
Subjective:   Catherine Daniel is a 76 y.o. female who presents for Medicare Annual (Subsequent) preventive examination. I connected with  Catherine Daniel on 02/12/22 by a audio enabled telemedicine application and verified that I am speaking with the correct person using two identifiers.  Patient Location: Home  Provider Location: Office/Clinic  I discussed the limitations of evaluation and management by telemedicine. The patient expressed understanding and agreed to proceed.   Review of Systems    Defer to PCP Cardiac Risk Factors include: advanced age (>13mn, >>66women);diabetes mellitus     Objective:    There were no vitals filed for this visit. There is no height or weight on file to calculate BMI.  Advanced Directives 02/08/2022  Does Patient Have a Medical Advance Directive? No  Would patient like information on creating a medical advance directive? No - Patient declined    Current Medications (verified) Outpatient Encounter Medications as of 02/08/2022  Medication Sig   Calcium Carb-Cholecalciferol (CALCIUM-VITAMIN D) 600-400 MG-UNIT TABS Take 1 tablet by mouth 2 (two) times daily.   Omega-3 Fatty Acids (FISH OIL) 1000 MG CAPS Take by mouth daily.     [DISCONTINUED] amLODipine (NORVASC) 5 MG tablet TAKE 1 TABLET BY MOUTH EVERY DAY   [DISCONTINUED] atorvastatin (LIPITOR) 20 MG tablet TAKE 1 TABLET BY MOUTH EVERY DAY   [DISCONTINUED] lisinopril (ZESTRIL) 20 MG tablet TAKE 1 TABLET BY MOUTH EVERY DAY   No facility-administered encounter medications on file as of 02/08/2022.    Allergies (verified) Patient has no known allergies.   History: Past Medical History:  Diagnosis Date   Arthritis    Diabetes mellitus type II    Highest A1c 6.6% in 2014.  Diet-controlled.   Diverticulosis    History of cardiac catheterization 1999   normal   Hyperlipidemia    Hypertension    Obesity, Class II, BMI 35-39.9    Situational anxiety    Flying, heights, dentists: xanax prn works  well   Tubular adenoma of colon 2011   At 09/19/16 GI visit she decided against repeat colonoscopy, and decided to pursue annual iFOB testing instead (Dr. PHilarie Fredrickson (iFOB neg 09/2016)   Past Surgical History:  Procedure Laterality Date   CHOLECYSTECTOMY  1997   for cholelithiasis   COLONOSCOPY  05/31/2010   Descending colon and sigmoid diverticulosis-severe.  Polypectomy x 1.  Recall 5 yrs (Dr. PSharlett Iles.  Pt chose not to get repeat colonoscopy due to bad experience with procedure in 2011.  She will do annal iFOB testing---has agreed to do colonoscopy IF one returns positive.   TONSILLECTOMY     Family History  Problem Relation Age of Onset   Hyperlipidemia Mother    Coronary artery disease Mother    Diabetes Sister    Social History   Socioeconomic History   Marital status: Married    Spouse name: Not on file   Number of children: 2   Years of education: Not on file   Highest education level: Not on file  Occupational History   Occupation: housewife  Tobacco Use   Smoking status: Former    Types: Cigarettes    Quit date: 12/03/1989    Years since quitting: 32.2   Smokeless tobacco: Never  Vaping Use   Vaping Use: Never used  Substance and Sexual Activity   Alcohol use: Yes    Comment: wine once in a while   Drug use: No   Sexual activity: Not on file  Other Topics Concern   Not  on file  Social History Narrative   Married, 2 sons.   Orig from [place], now lives in [place].   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   Tob: 20 pack-yr hx, quit 1990.  Alcohol: none.   No formal exercise.   Social Determinants of Health   Financial Resource Strain: Not on file  Food Insecurity: No Food Insecurity   Worried About Charity fundraiser in the Last Year: Never true   Ran Out of Food in the Last Year: Never true  Transportation Needs: No Transportation Needs   Lack of Transportation (Medical): No   Lack of Transportation (Non-Medical): No  Physical  Activity: Inactive   Days of Exercise per Week: 0 days   Minutes of Exercise per Session: 0 min  Stress: No Stress Concern Present   Feeling of Stress : Not at all  Social Connections: Moderately Isolated   Frequency of Communication with Friends and Family: More than three times a week   Frequency of Social Gatherings with Friends and Family: Once a week   Attends Religious Services: Never   Marine scientist or Organizations: No   Attends Music therapist: Never   Marital Status: Married    Tobacco Counseling Counseling given: Not Answered   Clinical Intake:  Pre-visit preparation completed: Yes  Pain : No/denies pain     Nutritional Risks: None Diabetes: Yes CBG done?: No Did pt. bring in CBG monitor from home?: No  How often do you need to have someone help you when you read instructions, pamphlets, or other written materials from your doctor or pharmacy?: 1 - Never What is the last grade level you completed in school?: 12  Diabetic?No  Interpreter Needed?: No      Activities of Daily Living In your present state of health, do you have any difficulty performing the following activities: 02/08/2022  Hearing? N  Vision? Y  Difficulty concentrating or making decisions? N  Walking or climbing stairs? Y  Dressing or bathing? N  Doing errands, shopping? N  Preparing Food and eating ? N  Using the Toilet? N  In the past six months, have you accidently leaked urine? Y  Do you have problems with loss of bowel control? N  Managing your Medications? N  Managing your Finances? N  Housekeeping or managing your Housekeeping? N  Some recent data might be hidden    Patient Care Team: Tammi Sou, MD as PCP - General (Family Medicine) Pyrtle, Lajuan Lines, MD as Consulting Physician (Gastroenterology)  Indicate any recent Medical Services you may have received from other than [facility] providers in the past year (date may be approximate).     Assessment:    This is a routine wellness examination for Catherine Daniel.  Hearing/Vision screen No results found.  Dietary issues and exercise activities discussed: Current Exercise Habits: Home exercise routine, Time (Minutes): 20, Frequency (Times/Week): 3, Weekly Exercise (Minutes/Week): 60, Intensity: Mild, Exercise limited by: orthopedic condition(s)   Goals Addressed   None   Depression Screen PHQ 2/9 Scores 02/08/2022 06/22/2021 01/22/2020 07/20/2019 01/16/2019 01/17/2016 05/13/2014  PHQ - 2 Score 0 0 0 0 0 0 0  PHQ- 9 Score - - 0 2 0 - -    Fall Risk Fall Risk  02/08/2022 06/22/2021 01/22/2020 10/26/2019 01/15/2018  Falls in the past year? 0 0 0 0 No  Comment - - - Emmi Telephone Survey: data to providers prior to load -  Number falls in past yr: 0 0 0 - -  Injury with Fall? 0 0 0 - -  Follow up Falls evaluation completed Falls evaluation completed Falls evaluation completed - -    FALL RISK PREVENTION PERTAINING TO THE HOME:  Any stairs in or around the home? Yes  If so, are there any without handrails? Yes  Home free of loose throw rugs in walkways, pet beds, electrical cords, etc? Yes  Adequate lighting in your home to reduce risk of falls? Yes   ASSISTIVE DEVICES UTILIZED TO PREVENT FALLS:  Life alert? No  Use of a cane, walker or w/c? Yes  Grab bars in the bathroom? Yes  Shower chair or bench in shower? Yes  Elevated toilet seat or a handicapped toilet? Yes   TIMED UP AND GO:  Was the test performed? No .  Length of time to ambulate 10 feet: n/a sec.     Cognitive Function:     6CIT Screen 02/08/2022  What Year? 0 points  What month? 0 points  What time? 0 points  Count back from 20 0 points  Months in reverse 0 points  Repeat phrase 0 points  Total Score 0    Immunizations Immunization History  Administered Date(s) Administered   PFIZER(Purple Top)SARS-COV-2 Vaccination 01/31/2020, 03/01/2020, 10/04/2020   Pneumococcal Conjugate-13 05/13/2014   Pneumococcal  Polysaccharide-23 05/07/2011   Td 04/02/2008   Tdap 07/27/2018    TDAP status: Up to date  Flu Vaccine status: Declined, Education has been provided regarding the importance of this vaccine but patient still declined. Advised may receive this vaccine at local pharmacy or Health Dept. Aware to provide a copy of the vaccination record if obtained from local pharmacy or Health Dept. Verbalized acceptance and understanding.  Pneumococcal vaccine status: Up to date  Covid-19 vaccine status: Information provided on how to obtain vaccines.   Qualifies for Shingles Vaccine? No   Zostavax completed No   Shingrix Completed?: No.    Education has been provided regarding the importance of this vaccine. Patient has been advised to call insurance company to determine out of pocket expense if they have not yet received this vaccine. Advised may also receive vaccine at local pharmacy or Health Dept. Verbalized acceptance and understanding.  Screening Tests Health Maintenance  Topic Date Due   Zoster Vaccines- Shingrix (1 of 2) Never done   OPHTHALMOLOGY EXAM  03/21/2011   HEMOGLOBIN A1C  12/23/2021   COVID-19 Vaccine (4 - Booster for Pfizer series) 02/24/2022 (Originally 11/29/2020)   INFLUENZA VACCINE  03/02/2022 (Originally 07/03/2021)   DEXA SCAN  02/09/2023 (Originally 04/27/2010)   FOOT EXAM  06/22/2022   TETANUS/TDAP  07/27/2028   Pneumonia Vaccine 55+ Years old  Completed   HPV VACCINES  Aged Out   COLONOSCOPY (Pts 45-70yr Insurance coverage will need to be confirmed)  Discontinued   Hepatitis C Screening  Discontinued    Health Maintenance  Health Maintenance Due  Topic Date Due   Zoster Vaccines- Shingrix (1 of 2) Never done   OPHTHALMOLOGY EXAM  03/21/2011   HEMOGLOBIN A1C  12/23/2021    Colorectal cancer screening: No longer required.   Mammogram status: No longer required due to age.  Bone Density status: Pt provided with contact info and advised to call to schedule appt. Pt  declined.   Lung Cancer Screening: (Low Dose CT Chest recommended if Age 77-80years, 30 pack-year currently smoking OR have quit w/in 15years.) does not qualify.   Lung Cancer Screening Referral:  n/a  Additional Screening:  Hepatitis C Screening: does not qualify; Completed n/a  Vision Screening: Recommended annual ophthalmology exams for early detection of glaucoma and other disorders of the eye. Is the patient up to date with their annual eye exam?  No  Who is the provider or what is the name of the office in which the patient attends annual eye exams? N/a If pt is not established with a provider, would they like to be referred to a provider to establish care? No .   Dental Screening: Recommended annual dental exams for proper oral hygiene  Community Resource Referral / Chronic Care Management: CRR required this visit?  No   CCM required this visit?  No      Plan:     I have personally reviewed and noted the following in the patients chart:   Medical and social history Use of alcohol, tobacco or illicit drugs  Current medications and supplements including opioid prescriptions.  Functional ability and status Nutritional status Physical activity Advanced directives List of other physicians Hospitalizations, surgeries, and ER visits in previous 12 months Vitals Screenings to include cognitive, depression, and falls Referrals and appointments  In addition, I have reviewed and discussed with patient certain preventive protocols, quality metrics, and best practice recommendations. A written personalized care plan for preventive services as well as general preventive health recommendations were provided to patient.     Kavin Leech, [facility]   02/12/2022   Nurse Notes: Non face to face 25 minutes.  Ms. Kayes , Thank you for taking time to come for your Medicare Wellness Visit. I appreciate your ongoing commitment to your health goals. Please review the following plan we  discussed and let me know if I can assist you in the future.   These are the goals we discussed:  Goals   None     This is a list of the screening recommended for you and due dates:  Health Maintenance  Topic Date Due   Zoster (Shingles) Vaccine (1 of 2) Never done   Eye exam for diabetics  03/21/2011   Hemoglobin A1C  12/23/2021   COVID-19 Vaccine (4 - Booster for Pfizer series) 02/24/2022*   Flu Shot  03/02/2022*   DEXA scan (bone density measurement)  02/09/2023*   Complete foot exam   06/22/2022   Tetanus Vaccine  07/27/2028   Pneumonia Vaccine  Completed   HPV Vaccine  Aged Out   Colon Cancer Screening  Discontinued   Hepatitis C Screening: USPSTF Recommendation to screen - Ages 55-79 yo.  Discontinued  *Topic was postponed. The date shown is not the original due date.

## 2022-02-12 ENCOUNTER — Other Ambulatory Visit: Payer: Self-pay | Admitting: Family Medicine

## 2022-02-22 ENCOUNTER — Encounter: Payer: Self-pay | Admitting: Family Medicine

## 2022-02-22 ENCOUNTER — Other Ambulatory Visit: Payer: Self-pay

## 2022-02-22 ENCOUNTER — Ambulatory Visit (INDEPENDENT_AMBULATORY_CARE_PROVIDER_SITE_OTHER): Payer: Medicare Other | Admitting: Family Medicine

## 2022-02-22 VITALS — BP 140/70 | HR 65 | Temp 97.4°F | Ht 63.5 in | Wt 199.8 lb

## 2022-02-22 DIAGNOSIS — E7800 Pure hypercholesterolemia, unspecified: Secondary | ICD-10-CM | POA: Diagnosis not present

## 2022-02-22 DIAGNOSIS — I10 Essential (primary) hypertension: Secondary | ICD-10-CM

## 2022-02-22 DIAGNOSIS — E119 Type 2 diabetes mellitus without complications: Secondary | ICD-10-CM | POA: Diagnosis not present

## 2022-02-22 LAB — LIPID PANEL
Cholesterol: 169 mg/dL (ref 0–200)
HDL: 49.7 mg/dL (ref >39.00)
LDL Cholesterol: 96 mg/dL (ref 0–99)
NonHDL: 119.45
Total CHOL/HDL Ratio: 3
Triglycerides: 118 mg/dL (ref 0.0–149.0)
VLDL: 23.6 mg/dL (ref 0.0–40.0)

## 2022-02-22 LAB — COMPREHENSIVE METABOLIC PANEL
ALT: 23 U/L (ref 0–35)
AST: 24 U/L (ref 0–37)
Albumin: 4.7 g/dL (ref 3.5–5.2)
Alkaline Phosphatase: 79 U/L (ref 39–117)
BUN: 14 mg/dL (ref 6–23)
CO2: 30 mEq/L (ref 19–32)
Calcium: 10.6 mg/dL — ABNORMAL HIGH (ref 8.4–10.5)
Chloride: 103 mEq/L (ref 96–112)
Creatinine, Ser: 0.65 mg/dL (ref 0.40–1.20)
GFR: 85.28 mL/min (ref >60.00)
Glucose, Bld: 114 mg/dL — ABNORMAL HIGH (ref 70–99)
Potassium: 4.6 mEq/L (ref 3.5–5.1)
Sodium: 140 mEq/L (ref 135–145)
Total Bilirubin: 0.8 mg/dL (ref 0.2–1.2)
Total Protein: 7.1 g/dL (ref 6.0–8.3)

## 2022-02-22 LAB — HEMOGLOBIN A1C: Hgb A1c MFr Bld: 7 % — ABNORMAL HIGH (ref 4.6–6.5)

## 2022-02-22 MED ORDER — AMLODIPINE BESYLATE 5 MG PO TABS: 5.0000 mg | ORAL_TABLET | Freq: Every day | ORAL | 1 refills | Status: DC

## 2022-02-22 MED ORDER — LISINOPRIL 20 MG PO TABS: 20.0000 mg | ORAL_TABLET | Freq: Every day | ORAL | 1 refills | Status: DC

## 2022-02-22 MED ORDER — ATORVASTATIN CALCIUM 20 MG PO TABS: 20.0000 mg | ORAL_TABLET | Freq: Every day | ORAL | 1 refills | Status: DC

## 2022-02-22 NOTE — Progress Notes (Signed)
OFFICE VISIT ? ?02/22/2022 ? ?CC:  ?Chief Complaint  ?Patient presents with  ? Hypertension  ? Hyperlipidemia  ?  Pt is fasting  ? Diabetes  ? ? ?HPI:   ? ?Patient is a 76 y.o. female who presents for 8 month f/u DM 2, HTN,HLD. ?A/P as of last visit: ?"1) DM 2, diet controlled. ?Hba1ct today. ?Feet exam normal today. ?Lytes/cr today. ?  ?2) HTN, well controlled on lisin 20 qd and amlod 5 qd. ?Lytes/cr today. ?  ?3) HLD: atorva 20 qd long term. ?LDL 83 sept 2021. ?FLP and hepatic panel today. ?  ?4) Preventative health: She continues to decline all screening (mammogram, DEXA, Hep C, pap smears, colon ca screening)." ? ?INTERIM HX: ?All labs stable last visit, including Hba1c 6.9%. ?She is doing well. ?She gets out and is active with her husband socially. ? ?Compliant with medications. ?Home blood pressure checks consistently 120s over 60s, sometimes even more around 174 systolic. ?No dizziness or fatigue. ? ?ROS as above, plus--> no fevers, no CP, no SOB, no wheezing, no cough, no dizziness, no HAs, no rashes, no melena/hematochezia.  No polyuria or polydipsia.  No myalgias or arthralgias.  No focal weakness, paresthesias, or tremors.  No acute vision or hearing abnormalities.  No dysuria or unusual/new urinary urgency or frequency.  No recent changes in lower legs. ?No n/v/d or abd pain.  No palpitations.   ? ? ?Past Medical History:  ?Diagnosis Date  ? Arthritis   ? COVID-19 virus infection 09/2021  ? paxlovid  ? Diabetes mellitus type II   ? Highest A1c 6.6% in 2014.  Diet-controlled.  ? Diverticulosis   ? History of cardiac catheterization 1999  ? normal  ? Hyperlipidemia   ? Hypertension   ? Obesity, Class II, BMI 35-39.9   ? Situational anxiety   ? Flying, heights, dentists: xanax prn works well  ? Tubular adenoma of colon 2011  ? At 09/19/16 GI visit she decided against repeat colonoscopy, and decided to pursue annual iFOB testing instead (Dr. Hilarie Fredrickson) (iFOB neg 09/2016)  ? ? ?Past Surgical History:  ?Procedure  Laterality Date  ? CHOLECYSTECTOMY  1997  ? for cholelithiasis  ? COLONOSCOPY  05/31/2010  ? Descending colon and sigmoid diverticulosis-severe.  Polypectomy x 1.  Recall 5 yrs (Dr. Sharlett Iles).  Pt chose not to get repeat colonoscopy due to bad experience with procedure in 2011.  She will do annal iFOB testing---has agreed to do colonoscopy IF one returns positive.  ? TONSILLECTOMY    ? ? ?Outpatient Medications Prior to Visit  ?Medication Sig Dispense Refill  ? amLODipine (NORVASC) 5 MG tablet TAKE 1 TABLET BY MOUTH EVERY DAY 30 tablet 0  ? atorvastatin (LIPITOR) 20 MG tablet TAKE 1 TABLET BY MOUTH EVERY DAY 30 tablet 0  ? Calcium Carb-Cholecalciferol (CALCIUM-VITAMIN D) 600-400 MG-UNIT TABS Take 1 tablet by mouth 2 (two) times daily.    ? lisinopril (ZESTRIL) 20 MG tablet TAKE 1 TABLET BY MOUTH EVERY DAY 30 tablet 0  ? Omega-3 Fatty Acids (FISH OIL) 1000 MG CAPS Take by mouth daily.      ? ?No facility-administered medications prior to visit.  ? ? ?No Known Allergies ? ?ROS ?As per HPI ? ?PE: ? ?  02/22/2022  ?  1:02 PM 09/29/2021  ?  2:40 PM 06/22/2021  ? 10:54 AM  ?Vitals with BMI  ?Height 5' 3.5"  5' 3.5"  ?Weight 199 lbs 13 oz 201 lbs 201 lbs  ?BMI 34.83  35.04  ?Systolic 299  242  ?Diastolic 81  79  ?Pulse 65  74  ?Manual bp repeat today: 140/70 ? ?Physical Exam ? ?Gen: Alert, well appearing.  Patient is oriented to person, place, time, and situation. ?AFFECT: pleasant, lucid thought and speech. ?CV: RRR, no m/r/g.   ?LUNGS: CTA bilat, nonlabored resps, good aeration in all lung fields. ?EXT: no clubbing or cyanosis.  No edema.  ? ? ?LABS:  ?Last CBC ?Lab Results  ?Component Value Date  ? WBC 6.6 01/11/2016  ? HGB 14.5 01/11/2016  ? HCT 44.7 01/11/2016  ? MCV 81.1 01/11/2016  ? RDW 14.6 01/11/2016  ? PLT 136.0 (L) 01/11/2016  ? ?Last metabolic panel ?Lab Results  ?Component Value Date  ? GLUCOSE 127 (H) 06/22/2021  ? NA 139 06/22/2021  ? K 4.3 06/22/2021  ? CL 102 06/22/2021  ? CO2 25 06/22/2021  ? BUN 13  06/22/2021  ? CREATININE 0.75 06/22/2021  ? CALCIUM 10.5 06/22/2021  ? PROT 7.0 06/22/2021  ? ALBUMIN 4.5 06/22/2021  ? BILITOT 0.9 06/22/2021  ? ALKPHOS 73 06/22/2021  ? AST 21 06/22/2021  ? ALT 21 06/22/2021  ? ?Last lipids ?Lab Results  ?Component Value Date  ? CHOL 160 06/22/2021  ? HDL 46.70 06/22/2021  ? [place] 89 06/22/2021  ? TRIG 121.0 06/22/2021  ? CHOLHDL 3 06/22/2021  ? ?Last hemoglobin A1c ?Lab Results  ?Component Value Date  ? HGBA1C 6.9 (H) 06/22/2021  ? ?Lab Results  ?Component Value Date  ? TSH 1.61 01/11/2016  ? ?IMPRESSION AND PLAN: ? ?#1 type 2 diabetes without complication. ?Diet only. ?Hemoglobin A1c and urine microalbumin/creatinine today. ? ?2.  Hypertension, well controlled per home measurements.  She has whitecoat component. ?Continue amlodipine 5 mg a day and lisinopril 20 mg a day. ?Electrolytes and creatinine today. ? ?3.  Hyperlipidemia, atorvastatin 20 mg a day. ?LDL was 89 about 8 months ago. ?Lipid panel and hepatic panel today. ? ?4. Preventative health: She continues to decline all screening (mammogram, DEXA, Hep C, pap smears, colon ca screening). ? ?An After Visit Summary was printed and given to the patient. ? ?FOLLOW UP: No follow-ups on file. ? ?Signed:  Crissie Sickles, MD           02/22/2022 ? ?

## 2022-05-01 ENCOUNTER — Encounter: Payer: Self-pay | Admitting: Family Medicine

## 2022-05-01 ENCOUNTER — Ambulatory Visit (INDEPENDENT_AMBULATORY_CARE_PROVIDER_SITE_OTHER): Payer: Medicare Other | Admitting: Family Medicine

## 2022-05-01 VITALS — BP 173/81 | HR 60 | Temp 98.4°F | Ht 63.5 in | Wt 201.6 lb

## 2022-05-01 DIAGNOSIS — J069 Acute upper respiratory infection, unspecified: Secondary | ICD-10-CM | POA: Diagnosis not present

## 2022-05-01 MED ORDER — BENZONATATE 200 MG PO CAPS: 200.0000 mg | ORAL_CAPSULE | Freq: Three times a day (TID) | ORAL | 0 refills | Status: DC | PRN

## 2022-05-01 NOTE — Progress Notes (Signed)
OFFICE VISIT  05/01/2022  CC:  Chief Complaint  Patient presents with   Cough    Sometimes dry cough and other times productive; little to no mucus Pt also c/o head and chest congestion. Soreness in chest from coughing. Took Robitussin and Mucinex for 2 days then stopped due to ineffectiveness. Denies fever.    Patient is a 76 y.o. female who presents for cough.  HPI: About 1 week history of nasal congestion and cough.  Feels like a chest cough to her but it is not particularly productive of sputum.  No wheezing.  No fever.  No shortness of breath.  She does feel tired/fatigued but this is not anything particularly new for her.  No body aches.  No headaches, no face pain. Has tried Mucinex and Robitussin without help.  In fact, these dried her out quite a bit.  Past Medical History:  Diagnosis Date   Arthritis    COVID-19 virus infection 09/2021   paxlovid   Diabetes mellitus type II    Highest A1c 6.6% in 2014.  Diet-controlled.   Diverticulosis    History of cardiac catheterization 1999   normal   Hyperlipidemia    Hypertension    Obesity, Class II, BMI 35-39.9    Situational anxiety    Flying, heights, dentists: xanax prn works well   Tubular adenoma of colon 2011   At 09/19/16 GI visit she decided against repeat colonoscopy, and decided to pursue annual iFOB testing instead (Dr. Hilarie Fredrickson) (iFOB neg 09/2016)    Past Surgical History:  Procedure Laterality Date   CHOLECYSTECTOMY  1997   for cholelithiasis   COLONOSCOPY  05/31/2010   Descending colon and sigmoid diverticulosis-severe.  Polypectomy x 1.  Recall 5 yrs (Dr. Sharlett Iles).  Pt chose not to get repeat colonoscopy due to bad experience with procedure in 2011.  She will do annal iFOB testing---has agreed to do colonoscopy IF one returns positive.   TONSILLECTOMY      Outpatient Medications Prior to Visit  Medication Sig Dispense Refill   amLODipine (NORVASC) 5 MG tablet Take 1 tablet (5 mg total) by mouth daily. 90  tablet 1   atorvastatin (LIPITOR) 20 MG tablet Take 1 tablet (20 mg total) by mouth daily. 90 tablet 1   Calcium Carb-Cholecalciferol (CALCIUM-VITAMIN D) 600-400 MG-UNIT TABS Take 1 tablet by mouth 2 (two) times daily.     lisinopril (ZESTRIL) 20 MG tablet Take 1 tablet (20 mg total) by mouth daily. 90 tablet 1   Omega-3 Fatty Acids (FISH OIL) 1000 MG CAPS Take by mouth daily.       No facility-administered medications prior to visit.    No Known Allergies  ROS As per HPI  PE:    05/01/2022   10:50 AM 02/22/2022    1:11 PM 02/22/2022    1:02 PM  Vitals with BMI  Height 5' 3.5"  5' 3.5"  Weight 201 lbs 10 oz  199 lbs 13 oz  BMI 18.56  31.49  Systolic 702 637 858  Diastolic 81 70 81  Pulse 60  65  02 sat 97% RA  Physical Exam  VS: noted--normal. Gen: alert, NAD, NONTOXIC APPEARING. HEENT: eyes without injection, drainage, or swelling.  Ears: EACs clear, TMs with normal light reflex and landmarks.  Nose: Clear rhinorrhea, with some dried, crusty exudate adherent to mildly injected mucosa.  No purulent d/c.  No paranasal sinus TTP.  No facial swelling.  Throat and mouth without focal lesion.  No pharyngial swelling,  erythema, or exudate.   Neck: supple, no LAD.   LUNGS: CTA bilat, nonlabored resps.   CV: RRR, no m/r/g. EXT: no c/c/e SKIN: no rash   LABS:  Last CBC Lab Results  Component Value Date   WBC 6.6 01/11/2016   HGB 14.5 01/11/2016   HCT 44.7 01/11/2016   MCV 81.1 01/11/2016   RDW 14.6 01/11/2016   PLT 136.0 (L) 07/86/7544   Last metabolic panel Lab Results  Component Value Date   GLUCOSE 114 (H) 02/22/2022   NA 140 02/22/2022   K 4.6 02/22/2022   CL 103 02/22/2022   CO2 30 02/22/2022   BUN 14 02/22/2022   CREATININE 0.65 02/22/2022   CALCIUM 10.6 (H) 02/22/2022   PROT 7.1 02/22/2022   ALBUMIN 4.7 02/22/2022   BILITOT 0.8 02/22/2022   ALKPHOS 79 02/22/2022   AST 24 02/22/2022   ALT 23 02/22/2022   IMPRESSION AND PLAN:  URI with  cough/congestion.  Suspect viral etiology. Discussed ongoing symptomatic care. Prescribed Tessalon 200 mg, 1 3 times daily as needed cough. Signs/symptoms to call or return for were reviewed and pt expressed understanding.  An After Visit Summary was printed and given to the patient.  FOLLOW UP: No follow-ups on file.  Signed:  Crissie Sickles, MD           05/01/2022

## 2022-07-25 ENCOUNTER — Other Ambulatory Visit: Payer: Self-pay | Admitting: Family Medicine

## 2022-07-25 DIAGNOSIS — Z1231 Encounter for screening mammogram for malignant neoplasm of breast: Secondary | ICD-10-CM

## 2022-08-22 ENCOUNTER — Ambulatory Visit (INDEPENDENT_AMBULATORY_CARE_PROVIDER_SITE_OTHER): Payer: Medicare Other | Admitting: Family Medicine

## 2022-08-22 ENCOUNTER — Encounter: Payer: Self-pay | Admitting: Family Medicine

## 2022-08-22 VITALS — BP 150/78 | HR 72 | Temp 97.9°F | Ht 63.5 in | Wt 197.4 lb

## 2022-08-22 DIAGNOSIS — E119 Type 2 diabetes mellitus without complications: Secondary | ICD-10-CM | POA: Diagnosis not present

## 2022-08-22 DIAGNOSIS — I10 Essential (primary) hypertension: Secondary | ICD-10-CM

## 2022-08-22 DIAGNOSIS — E7800 Pure hypercholesterolemia, unspecified: Secondary | ICD-10-CM | POA: Diagnosis not present

## 2022-08-22 LAB — COMPREHENSIVE METABOLIC PANEL
ALT: 24 U/L (ref 0–35)
AST: 21 U/L (ref 0–37)
Albumin: 4.3 g/dL (ref 3.5–5.2)
Alkaline Phosphatase: 79 U/L (ref 39–117)
BUN: 16 mg/dL (ref 6–23)
CO2: 26 mEq/L (ref 19–32)
Calcium: 10.7 mg/dL — ABNORMAL HIGH (ref 8.4–10.5)
Chloride: 101 mEq/L (ref 96–112)
Creatinine, Ser: 0.82 mg/dL (ref 0.40–1.20)
GFR: 69.04 mL/min (ref >60.00)
Glucose, Bld: 138 mg/dL — ABNORMAL HIGH (ref 70–99)
Potassium: 4.1 mEq/L (ref 3.5–5.1)
Sodium: 137 mEq/L (ref 135–145)
Total Bilirubin: 0.7 mg/dL (ref 0.2–1.2)
Total Protein: 7.4 g/dL (ref 6.0–8.3)

## 2022-08-22 LAB — POCT GLYCOSYLATED HEMOGLOBIN (HGB A1C)
HbA1c POC (<> result, manual entry): 6.5 % (ref 4.0–5.6)
HbA1c, POC (controlled diabetic range): 6.5 % (ref 0.0–7.0)
HbA1c, POC (prediabetic range): 6.5 % — AB (ref 5.7–6.4)
Hemoglobin A1C: 6.5 % — AB (ref 4.0–5.6)

## 2022-08-22 LAB — LIPID PANEL
Cholesterol: 165 mg/dL (ref 0–200)
HDL: 47 mg/dL (ref >39.00)
LDL Cholesterol: 93 mg/dL (ref 0–99)
NonHDL: 118.03
Total CHOL/HDL Ratio: 4
Triglycerides: 125 mg/dL (ref 0.0–149.0)
VLDL: 25 mg/dL (ref 0.0–40.0)

## 2022-08-22 LAB — MICROALBUMIN / CREATININE URINE RATIO
Creatinine,U: 84.5 mg/dL
Microalb Creat Ratio: 0.8 mg/g (ref 0.0–30.0)
Microalb, Ur: <0.7 mg/dL (ref 0.0–1.9)

## 2022-08-22 MED ORDER — LISINOPRIL 20 MG PO TABS
20.0000 mg | ORAL_TABLET | Freq: Every day | ORAL | 1 refills | Status: DC
Start: 2022-08-22 — End: 2023-03-19

## 2022-08-22 MED ORDER — ATORVASTATIN CALCIUM 20 MG PO TABS: 20.0000 mg | ORAL_TABLET | Freq: Every day | ORAL | 1 refills | Status: DC

## 2022-08-22 MED ORDER — AMLODIPINE BESYLATE 5 MG PO TABS: 5.0000 mg | ORAL_TABLET | Freq: Every day | ORAL | 1 refills | Status: DC

## 2022-08-22 NOTE — Progress Notes (Signed)
OFFICE VISIT  08/22/2022  CC:  Chief Complaint  Patient presents with   Diabetes   Hypertension   Hyperlipidemia    Pt is fasting    HPI:    Patient is a 76 y.o. female who presents for 52-monthfollow-up follow-up diabetes, hypertension, hyperlipidemia. A/P as of last visit: "#1 type 2 diabetes without complication. Diet only. Hemoglobin A1c and urine microalbumin/creatinine today.  2.  Hypertension, well controlled per home measurements.  She has whitecoat component. Continue amlodipine 5 mg a day and lisinopril 20 mg a day. Electrolytes and creatinine today.  3.  Hyperlipidemia, atorvastatin 20 mg a day. LDL was 89 about 8 months ago. Lipid panel and hepatic panel today.   4. Preventative health: She continues to decline all screening (mammogram, DEXA, Hep C, pap smears, colon ca screening)."  INTERIM HX: [place] other than being very nervous when she comes in here. Home blood pressures are consistently less than 130/80.  She has occasional feeling of mild tingling in the toes.  ROS as above, plus--> no fevers, no CP, no SOB, no wheezing, no cough, no dizziness, no HAs, no rashes, no melena/hematochezia.  No polyuria or polydipsia.  No myalgias.  No focal weakness or tremors.  No acute vision or hearing abnormalities.  No dysuria or unusual/new urinary urgency or frequency.  No recent changes in lower legs. No n/v/d or abd pain.  No palpitations.     Past Medical History:  Diagnosis Date   COVID-19 virus infection 09/2021   paxlovid   Diabetes mellitus type II    Highest A1c 6.6% in 2014.  Diet-controlled.   Diverticulosis    History of cardiac catheterization 1999   normal   Hyperlipidemia    Hypertension    Obesity, Class II, BMI 35-39.9    Osteoarthritis of both knees    [place]   Situational anxiety    Flying, heights, dentists: xanax prn works well   Tubular adenoma of colon 2011    At 09/19/16 GI visit she decided against repeat colonoscopy, and decided to pursue annual iFOB testing instead (Dr. PHilarie Fredrickson (iFOB neg 09/2016)    Past Surgical History:  Procedure Laterality Date   CHOLECYSTECTOMY  1997   for cholelithiasis   COLONOSCOPY  05/31/2010   Descending colon and sigmoid diverticulosis-severe.  Polypectomy x 1.  Recall 5 yrs (Dr. PSharlett Iles.  Pt chose not to get repeat colonoscopy due to bad experience with procedure in 2011.  She will do annal iFOB testing---has agreed to do colonoscopy IF one returns positive.   TONSILLECTOMY      Outpatient Medications Prior to Visit  Medication Sig Dispense Refill   Calcium Carb-Cholecalciferol (CALCIUM-VITAMIN D) 600-400 MG-UNIT TABS Take 1 tablet by mouth 2 (two) times daily.     Omega-3 Fatty Acids (FISH OIL) 1000 MG CAPS Take by mouth daily.       amLODipine (NORVASC) 5 MG tablet Take 1 tablet (5 mg total) by mouth daily. 90 tablet 1   atorvastatin (LIPITOR) 20 MG tablet Take 1 tablet (20 mg total) by mouth daily. 90 tablet 1   benzonatate (TESSALON) 200 MG capsule Take 1 capsule (200 mg total) by mouth 3 (three) times daily as needed for cough. (Patient not taking: Reported on 08/22/2022) 30 capsule 0   lisinopril (ZESTRIL) 20 MG tablet Take 1 tablet (20 mg total) by mouth daily. 90 tablet 1   No  facility-administered medications prior to visit.    No Known Allergies  ROS As per HPI  PE:    08/22/2022   10:42 AM 05/01/2022   10:50 AM 02/22/2022    1:11 PM  Vitals with BMI  Height 5' 3.5" 5' 3.5"   Weight 197 lbs 6 oz 201 lbs 10 oz   BMI 23.95 32.02   Systolic 334 356 861  Diastolic 78 81 70  Pulse 72 60      Physical Exam  Gen: Alert, well appearing.  Patient is oriented to person, place, time, and situation.. AFFECT: pleasant, lucid thought and speech.  Foot exam -  no swelling, tenderness or skin or vascular lesions. Color and temperature is normal. Sensation is intact. Peripheral pulses are  palpable. Toenails are normal.  LABS:  Last CBC Lab Results  Component Value Date   WBC 6.6 01/11/2016   HGB 14.5 01/11/2016   HCT 44.7 01/11/2016   MCV 81.1 01/11/2016   RDW 14.6 01/11/2016   PLT 136.0 (L) 68/37/2902   Last metabolic panel Lab Results  Component Value Date   GLUCOSE 114 (H) 02/22/2022   NA 140 02/22/2022   K 4.6 02/22/2022   CL 103 02/22/2022   CO2 30 02/22/2022   BUN 14 02/22/2022   CREATININE 0.65 02/22/2022   CALCIUM 10.6 (H) 02/22/2022   PROT 7.1 02/22/2022   ALBUMIN 4.7 02/22/2022   BILITOT 0.8 02/22/2022   ALKPHOS 79 02/22/2022   AST 24 02/22/2022   ALT 23 02/22/2022   Last lipids Lab Results  Component Value Date   CHOL 169 02/22/2022   HDL 49.70 02/22/2022   LDLCALC 96 02/22/2022   TRIG 118.0 02/22/2022   CHOLHDL 3 02/22/2022   Last hemoglobin A1c Lab Results  Component Value Date   HGBA1C 6.5 (A) 08/22/2022   HGBA1C 6.5 08/22/2022   HGBA1C 6.5 (A) 08/22/2022   HGBA1C 6.5 08/22/2022   Last thyroid functions Lab Results  Component Value Date   TSH 1.61 01/11/2016   IMPRESSION AND PLAN:  #1 hypertension.  She definitely has a whitecoat component. We will continue amlodipine 5 mg a day and lisinopril 20 mg a day. Electrolytes and creatinine today.  2.  Hypercholesterolemia.  Has been doing well on atorvastatin 20 mg a day. Lipid panel and hepatic panel today.  3.  Diet-controlled diabetes. Well-controlled.  Point-of-care Hemoglobin A1c today was 6.5% (improved from 7% last visit). Feet exam normal today.  An After Visit Summary was printed and given to the patient.  FOLLOW UP: Return in about 6 months (around 02/20/2023) for routine chronic illness f/u.  Signed:  Crissie Sickles, MD           08/22/2022

## 2022-08-30 ENCOUNTER — Ambulatory Visit (INDEPENDENT_AMBULATORY_CARE_PROVIDER_SITE_OTHER): Payer: Medicare Other

## 2022-08-30 DIAGNOSIS — Z1231 Encounter for screening mammogram for malignant neoplasm of breast: Secondary | ICD-10-CM

## 2022-10-15 DIAGNOSIS — E1136 Type 2 diabetes mellitus with diabetic cataract: Secondary | ICD-10-CM | POA: Diagnosis not present

## 2022-10-15 DIAGNOSIS — H04123 Dry eye syndrome of bilateral lacrimal glands: Secondary | ICD-10-CM | POA: Diagnosis not present

## 2022-10-15 DIAGNOSIS — H25813 Combined forms of age-related cataract, bilateral: Secondary | ICD-10-CM | POA: Diagnosis not present

## 2022-10-15 LAB — HM DIABETES EYE EXAM

## 2023-02-04 ENCOUNTER — Telehealth: Payer: Self-pay | Admitting: Family Medicine

## 2023-02-04 NOTE — Telephone Encounter (Signed)
Copied from [place] [id number]. Topic: Medicare AWV >> Feb 04, 2023 12:47 PM Gillis Santa wrote: Reason for CRM: Called patient to schedule Medicare Annual Wellness Visit (AWV). Left message for patient to call back and schedule Medicare Annual Wellness Visit (AWV).  Last date of AWV: 02/08/2022  Please schedule an appointment at any time with Otila Kluver, [facility].  If any questions, please contact me at [phone].  Thank you ,  Shaune Pollack [facility] AWV TEAM Direct Dial [phone]

## 2023-02-25 ENCOUNTER — Telehealth: Payer: Self-pay | Admitting: Family Medicine

## 2023-02-25 NOTE — Telephone Encounter (Signed)
Contacted Catherine Daniel to schedule their annual wellness visit. Appointment made for 02/27/2023.  [place] Direct Dial [phone]

## 2023-02-27 ENCOUNTER — Ambulatory Visit (INDEPENDENT_AMBULATORY_CARE_PROVIDER_SITE_OTHER): Payer: Medicare Other

## 2023-02-27 VITALS — Wt 197.0 lb

## 2023-02-27 DIAGNOSIS — Z0000 Encounter for general adult medical examination without abnormal findings: Secondary | ICD-10-CM

## 2023-02-27 NOTE — Progress Notes (Signed)
I connected with  Catherine Daniel on 02/27/23 by a audio enabled telemedicine application and verified that I am speaking with the correct person using two identifiers.  Patient Location: Home  Provider Location: Home Office  I discussed the limitations of evaluation and management by telemedicine. The patient expressed understanding and agreed to proceed.   Subjective:   Catherine Daniel is a 77 y.o. female who presents for Medicare Annual (Subsequent) preventive examination.  Review of Systems     Cardiac Risk Factors include: advanced age (>63men, >37 women);hypertension;dyslipidemia;diabetes mellitus;obesity (BMI >30kg/m2)     Objective:    Today's Vitals   02/27/23 1051  Weight: 197 lb (89.4 kg)   Body mass index is 34.35 kg/m.     02/27/2023   10:57 AM 02/08/2022    3:48 PM  Advanced Directives  Does Patient Have a Medical Advance Directive? No No  Would patient like information on creating a medical advance directive? No - Patient declined No - Patient declined    Current Medications (verified) Outpatient Encounter Medications as of 02/27/2023  Medication Sig   amLODipine (NORVASC) 5 MG tablet Take 1 tablet (5 mg total) by mouth daily.   atorvastatin (LIPITOR) 20 MG tablet Take 1 tablet (20 mg total) by mouth daily.   Calcium Carb-Cholecalciferol (CALCIUM-VITAMIN D) 600-400 MG-UNIT TABS Take 1 tablet by mouth 2 (two) times daily.   lisinopril (ZESTRIL) 20 MG tablet Take 1 tablet (20 mg total) by mouth daily.   Omega-3 Fatty Acids (FISH OIL) 1000 MG CAPS Take by mouth daily.     No facility-administered encounter medications on file as of 02/27/2023.    Allergies (verified) Patient has no known allergies.   History: Past Medical History:  Diagnosis Date   COVID-19 virus infection 09/2021   paxlovid   Diabetes mellitus type II    Highest A1c 6.6% in 2014.  Diet-controlled.   Diverticulosis    History of cardiac catheterization 1999   normal   Hyperlipidemia     Hypertension    Obesity, Class II, BMI 35-39.9    Osteoarthritis of both knees    inj on R no help in remote past (ortho in [place] but pt cannot recall name)   Situational anxiety    Flying, heights, dentists: xanax prn works well   Tubular adenoma of colon 2011   At 09/19/16 GI visit she decided against repeat colonoscopy, and decided to pursue annual iFOB testing instead (Dr. Hilarie Fredrickson) (iFOB neg 09/2016)   Past Surgical History:  Procedure Laterality Date   CHOLECYSTECTOMY  1997   for cholelithiasis   COLONOSCOPY  05/31/2010   Descending colon and sigmoid diverticulosis-severe.  Polypectomy x 1.  Recall 5 yrs (Dr. Sharlett Iles).  Pt chose not to get repeat colonoscopy due to bad experience with procedure in 2011.  She will do annal iFOB testing---has agreed to do colonoscopy IF one returns positive.   TONSILLECTOMY     Family History  Problem Relation Age of Onset   Hyperlipidemia Mother    Coronary artery disease Mother    Diabetes Sister    Social History   Socioeconomic History   Marital status: Married    Spouse name: Not on file   Number of children: 2   Years of education: Not on file   Highest education level: Not on file  Occupational History   Occupation: housewife  Tobacco Use   Smoking status: Former    Types: Cigarettes    Quit date: 12/03/1989    Years since  quitting: 33.2   Smokeless tobacco: Never  Vaping Use   Vaping Use: Never used  Substance and Sexual Activity   Alcohol use: Yes    Comment: wine once in a while   Drug use: No   Sexual activity: Not on file  Other Topics Concern   Not on file  Social History Narrative   Married, 2 sons.   Orig from [place], now lives in [place].   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   Tob: 20 pack-yr hx, quit 1990.  Alcohol: none.   No formal exercise.   Social Determinants of Health   Financial Resource Strain: Low Risk  (02/27/2023)   Overall Financial Resource Strain (CARDIA)     Difficulty of Paying Living Expenses: Not hard at all  Food Insecurity: No Food Insecurity (02/27/2023)   Hunger Vital Sign    Worried About Running Out of Food in the Last Year: Never true    Ran Out of Food in the Last Year: Never true  Transportation Needs: No Transportation Needs (02/27/2023)   PRAPARE - Hydrologist (Medical): No    Lack of Transportation (Non-Medical): No  Physical Activity: Inactive (02/27/2023)   Exercise Vital Sign    Days of Exercise per Week: 0 days    Minutes of Exercise per Session: 0 min  Stress: No Stress Concern Present (02/27/2023)   [place]    Feeling of Stress : Not at all  Social Connections: Moderately Isolated (02/27/2023)   Social Connection and Isolation Panel [NHANES]    Frequency of Communication with Friends and Family: More than three times a week    Frequency of Social Gatherings with Friends and Family: More than three times a week    Attends Religious Services: Never    Marine scientist or Organizations: No    Attends Music therapist: Never    Marital Status: Married    Tobacco Counseling Counseling given: Not Answered   Clinical Intake:  Pre-visit preparation completed: Yes  Pain : No/denies pain     BMI - recorded: 34.35 Nutritional Status: BMI > 30  Obese Nutritional Risks: None Diabetes: Yes CBG done?: No Did pt. bring in CBG monitor from home?: No  How often do you need to have someone help you when you read instructions, pamphlets, or other written materials from your doctor or pharmacy?: 1 - Never  Diabetic?Nutrition Risk Assessment:  Has the patient had any N/V/D within the last 2 months?  No  Does the patient have any non-healing wounds?  No  Has the patient had any unintentional weight loss or weight gain?  No   Diabetes:  Is the patient diabetic?  Yes  If diabetic, was a CBG obtained today?   No  Did the patient bring in their glucometer from home?  No  How often do you monitor your CBG's? N/a.   Financial Strains and Diabetes Management:  Are you having any financial strains with the device, your supplies or your medication? No .  Does the patient want to be seen by Chronic Care Management for management of their diabetes?  No  Would the patient like to be referred to a Nutritionist or for Diabetic Management?  No   Diabetic Exams:  Diabetic Eye Exam: Overdue for diabetic eye exam. Pt has been advised about the importance in completing this exam. Patient advised to call  and schedule an eye exam. Diabetic Foot Exam: Completed 08/22/22   Interpreter Needed?: No  Information entered by :: Charlott Rakes, LPN   Activities of Daily Living    02/27/2023   10:58 AM  In your present state of health, do you have any difficulty performing the following activities:  Hearing? 0  Vision? 0  Difficulty concentrating or making decisions? 0  Walking or climbing stairs? 0  Dressing or bathing? 0  Doing errands, shopping? 0  Preparing Food and eating ? N  Using the Toilet? N  In the past six months, have you accidently leaked urine? N  Do you have problems with loss of bowel control? N  Managing your Medications? N  Managing your Finances? N  Housekeeping or managing your Housekeeping? N    Patient Care Team: Tammi Sou, MD as PCP - General (Family Medicine) Pyrtle, Lajuan Lines, MD as Consulting Physician (Gastroenterology)  Indicate any recent Medical Services you may have received from other than [facility] providers in the past year (date may be approximate).     Assessment:   This is a routine wellness examination for Shelah.  Hearing/Vision screen Hearing Screening - Comments:: Pt denies any hearing issues  Vision Screening - Comments:: Pt follows up with Dr Gershon Crane for annual eye exams   Dietary issues and exercise activities discussed: Current Exercise Habits: The  patient does not participate in regular exercise at present   Goals Addressed             This Visit's Progress    Patient Stated       Pt stated to look better        Depression Screen    02/27/2023   10:55 AM 02/08/2022    3:40 PM 06/22/2021   10:58 AM 01/22/2020   10:57 AM 07/20/2019   11:03 AM 01/16/2019    9:50 AM 01/17/2016    1:32 PM  PHQ 2/9 Scores  PHQ - 2 Score 0 0 0 0 0 0 0  PHQ- 9 Score    0 2 0     Fall Risk    02/27/2023   10:57 AM 02/08/2022    3:48 PM 06/22/2021   10:58 AM 01/22/2020   10:57 AM 10/26/2019    9:26 AM  Fall Risk   Falls in the past year? 0 0 0 0 0  Comment     Emmi Telephone Survey: data to providers prior to load  Number falls in past yr: 0 0 0 0   Injury with Fall? 0 0 0 0   Risk for fall due to : Impaired vision;Impaired balance/gait      Follow up Falls prevention discussed Falls evaluation completed Falls evaluation completed Falls evaluation completed     FALL RISK PREVENTION PERTAINING TO THE HOME:  Any stairs in or around the home? Yes  If so, are there any without handrails? No  Home free of loose throw rugs in walkways, pet beds, electrical cords, etc? Yes  Adequate lighting in your home to reduce risk of falls? Yes   ASSISTIVE DEVICES UTILIZED TO PREVENT FALLS:  Life alert? No  Use of a cane, walker or w/c? Yes  Grab bars in the bathroom? No  Shower chair or bench in shower? No  Elevated toilet seat or a handicapped toilet? No   TIMED UP AND GO:  Was the test performed? No .   Cognitive Function:        02/27/2023   10:58  AM 02/08/2022    3:54 PM  6CIT Screen  What Year? 0 points 0 points  What month? 0 points 0 points  What time? 0 points 0 points  Count back from 20 0 points 0 points  Months in reverse 0 points 0 points  Repeat phrase 0 points 0 points  Total Score 0 points 0 points    Immunizations Immunization History  Administered Date(s) Administered   PFIZER(Purple Top)SARS-COV-2 Vaccination  01/31/2020, 03/01/2020, 10/04/2020   Pneumococcal Conjugate-13 05/13/2014   Pneumococcal Polysaccharide-23 05/07/2011   Td 04/02/2008   Tdap 07/27/2018    TDAP status: Up to date  Flu Vaccine status: Declined, Education has been provided regarding the importance of this vaccine but patient still declined. Advised may receive this vaccine at local pharmacy or Health Dept. Aware to provide a copy of the vaccination record if obtained from local pharmacy or Health Dept. Verbalized acceptance and understanding.  Pneumococcal vaccine status: Up to date  Covid-19 vaccine status: Completed vaccines  Qualifies for Shingles Vaccine? Yes   Zostavax completed No   Shingrix Completed?: No.    Education has been provided regarding the importance of this vaccine. Patient has been advised to call insurance company to determine out of pocket expense if they have not yet received this vaccine. Advised may also receive vaccine at local pharmacy or Health Dept. Verbalized acceptance and understanding.  Screening Tests Health Maintenance  Topic Date Due   Zoster Vaccines- Shingrix (1 of 2) Never done   DEXA SCAN  Never done   OPHTHALMOLOGY EXAM  03/21/2011   COVID-19 Vaccine (4 - 2023-24 season) 08/03/2022   HEMOGLOBIN A1C  02/20/2023   INFLUENZA VACCINE  03/03/2023 (Originally 07/03/2022)   Diabetic kidney evaluation - eGFR measurement  08/23/2023   Diabetic kidney evaluation - Urine ACR  08/23/2023   FOOT EXAM  08/23/2023   Medicare Annual Wellness (AWV)  02/27/2024   DTaP/Tdap/Td (3 - Td or Tdap) 07/27/2028   Pneumonia Vaccine 44+ Years old  Completed   HPV VACCINES  Aged Out   COLONOSCOPY (Pts 45-26yrs Insurance coverage will need to be confirmed)  Discontinued   Hepatitis C Screening  Discontinued    Health Maintenance  Health Maintenance Due  Topic Date Due   Zoster Vaccines- Shingrix (1 of 2) Never done   DEXA SCAN  Never done   OPHTHALMOLOGY EXAM  03/21/2011   COVID-19 Vaccine (4 -  2023-24 season) 08/03/2022   HEMOGLOBIN A1C  02/20/2023    Colorectal cancer screening: No longer required.   Mammogram status: Completed 08/31/22. Repeat every year  Bone density declined    Additional Screening:  Hepatitis C Screening: does not qualify;  Vision Screening: Recommended annual ophthalmology exams for early detection of glaucoma and other disorders of the eye. Is the patient up to date with their annual eye exam?  Yes  Who is the provider or what is the name of the office in which the patient attends annual eye exams? Dr Gershon Crane  If pt is not established with a provider, would they like to be referred to a provider to establish care? No .   Dental Screening: Recommended annual dental exams for proper oral hygiene  Community Resource Referral / Chronic Care Management: CRR required this visit?  No   CCM required this visit?  No      Plan:     I have personally reviewed and noted the following in the patient's chart:   Medical and social history Use of alcohol,  tobacco or illicit drugs  Current medications and supplements including opioid prescriptions. Patient is not currently taking opioid prescriptions. Functional ability and status Nutritional status Physical activity Advanced directives List of other physicians Hospitalizations, surgeries, and ER visits in previous 12 months Vitals Screenings to include cognitive, depression, and falls Referrals and appointments  In addition, I have reviewed and discussed with patient certain preventive protocols, quality metrics, and best practice recommendations. A written personalized care plan for preventive services as well as general preventive health recommendations were provided to patient.     Willette Brace, LPN   [id number]   Nurse Notes: none

## 2023-02-27 NOTE — Patient Instructions (Signed)
Catherine Daniel , Thank you for taking time to come for your Medicare Wellness Visit. I appreciate your ongoing commitment to your health goals. Please review the following plan we discussed and let me know if I can assist you in the future.   These are the goals we discussed:  Goals   None     This is a list of the screening recommended for you and due dates:  Health Maintenance  Topic Date Due   Zoster (Shingles) Vaccine (1 of 2) Never done   DEXA scan (bone density measurement)  Never done   Eye exam for diabetics  03/21/2011   COVID-19 Vaccine (4 - 2023-24 season) 08/03/2022   Hemoglobin A1C  02/20/2023   Flu Shot  03/03/2023*   Yearly kidney function blood test for diabetes  08/23/2023   Yearly kidney health urinalysis for diabetes  08/23/2023   Complete foot exam   08/23/2023   Medicare Annual Wellness Visit  02/27/2024   DTaP/Tdap/Td vaccine (3 - Td or Tdap) 07/27/2028   Pneumonia Vaccine  Completed   HPV Vaccine  Aged Out   Colon Cancer Screening  Discontinued   Hepatitis C Screening: USPSTF Recommendation to screen - Ages 80-79 yo.  Discontinued  *Topic was postponed. The date shown is not the original due date.    Advanced directives: Advance directive discussed with you today. Even though you declined this today please call our office should you change your mind and we can give you the proper paperwork for you to fill out.   Conditions/risks identified: to look better   Next appointment: Follow up in one year for your annual wellness visit    Preventive Care 65 Years and Older, Female Preventive care refers to lifestyle choices and visits with your health care provider that can promote health and wellness. What does preventive care include? A yearly physical exam. This is also called an annual well check. Dental exams once or twice a year. Routine eye exams. Ask your health care provider how often you should have your eyes checked. Personal lifestyle choices,  including: Daily care of your teeth and gums. Regular physical activity. Eating a healthy diet. Avoiding tobacco and drug use. Limiting alcohol use. Practicing safe sex. Taking low-dose aspirin every day. Taking vitamin and mineral supplements as recommended by your health care provider. What happens during an annual well check? The services and screenings done by your health care provider during your annual well check will depend on your age, overall health, lifestyle risk factors, and family history of disease. Counseling  Your health care provider may ask you questions about your: Alcohol use. Tobacco use. Drug use. Emotional well-being. Home and relationship well-being. Sexual activity. Eating habits. History of falls. Memory and ability to understand (cognition). Work and work Statistician. Reproductive health. Screening  You may have the following tests or measurements: Height, weight, and BMI. Blood pressure. Lipid and cholesterol levels. These may be checked every 5 years, or more frequently if you are over 74 years old. Skin check. Lung cancer screening. You may have this screening every year starting at age 28 if you have a 30-pack-year history of smoking and currently smoke or have quit within the past 15 years. Fecal occult blood test (FOBT) of the stool. You may have this test every year starting at age 64. Flexible sigmoidoscopy or colonoscopy. You may have a sigmoidoscopy every 5 years or a colonoscopy every 10 years starting at age 71. Hepatitis C blood test. Hepatitis B blood  test. Sexually transmitted disease (STD) testing. Diabetes screening. This is done by checking your blood sugar (glucose) after you have not eaten for a while (fasting). You may have this done every 1-3 years. Bone density scan. This is done to screen for osteoporosis. You may have this done starting at age 64. Mammogram. This may be done every 1-2 years. Talk to your health care provider  about how often you should have regular mammograms. Talk with your health care provider about your test results, treatment options, and if necessary, the need for more tests. Vaccines  Your health care provider may recommend certain vaccines, such as: Influenza vaccine. This is recommended every year. Tetanus, diphtheria, and acellular pertussis (Tdap, Td) vaccine. You may need a Td booster every 10 years. Zoster vaccine. You may need this after age 33. Pneumococcal 13-valent conjugate (PCV13) vaccine. One dose is recommended after age 56. Pneumococcal polysaccharide (PPSV23) vaccine. One dose is recommended after age 31. Talk to your health care provider about which screenings and vaccines you need and how often you need them. This information is not intended to replace advice given to you by your health care provider. Make sure you discuss any questions you have with your health care provider. Document Released: 12/16/2015 Document Revised: 08/08/2016 Document Reviewed: 09/20/2015 Elsevier Interactive Patient Education  2017 [place] Prevention in the Home Falls can cause injuries. They can happen to people of all ages. There are many things you can do to make your home safe and to help prevent falls. What can I do on the outside of my home? Regularly fix the edges of walkways and driveways and fix any cracks. Remove anything that might make you trip as you walk through a door, such as a raised step or threshold. Trim any bushes or trees on the path to your home. Use bright outdoor lighting. Clear any walking paths of anything that might make someone trip, such as rocks or tools. Regularly check to see if handrails are loose or broken. Make sure that both sides of any steps have handrails. Any raised decks and porches should have guardrails on the edges. Have any leaves, snow, or ice cleared regularly. Use sand or salt on walking paths during winter. Clean up any spills in  your garage right away. This includes oil or grease spills. What can I do in the bathroom? Use night lights. Install grab bars by the toilet and in the tub and shower. Do not use towel bars as grab bars. Use non-skid mats or decals in the tub or shower. If you need to sit down in the shower, use a plastic, non-slip stool. Keep the floor dry. Clean up any water that spills on the floor as soon as it happens. Remove soap buildup in the tub or shower regularly. Attach bath mats securely with double-sided non-slip rug tape. Do not have throw rugs and other things on the floor that can make you trip. What can I do in the bedroom? Use night lights. Make sure that you have a light by your bed that is easy to reach. Do not use any sheets or blankets that are too big for your bed. They should not hang down onto the floor. Have a firm chair that has side arms. You can use this for support while you get dressed. Do not have throw rugs and other things on the floor that can make you trip. What can I do in the kitchen? Clean up any spills right  away. Avoid walking on wet floors. Keep items that you use a lot in easy-to-reach places. If you need to reach something above you, use a strong step stool that has a grab bar. Keep electrical cords out of the way. Do not use floor polish or wax that makes floors slippery. If you must use wax, use non-skid floor wax. Do not have throw rugs and other things on the floor that can make you trip. What can I do with my stairs? Do not leave any items on the stairs. Make sure that there are handrails on both sides of the stairs and use them. Fix handrails that are broken or loose. Make sure that handrails are as long as the stairways. Check any carpeting to make sure that it is firmly attached to the stairs. Fix any carpet that is loose or worn. Avoid having throw rugs at the top or bottom of the stairs. If you do have throw rugs, attach them to the floor with carpet  tape. Make sure that you have a light switch at the top of the stairs and the bottom of the stairs. If you do not have them, ask someone to add them for you. What else can I do to help prevent falls? Wear shoes that: Do not have high heels. Have rubber bottoms. Are comfortable and fit you well. Are closed at the toe. Do not wear sandals. If you use a stepladder: Make sure that it is fully opened. Do not climb a closed stepladder. Make sure that both sides of the stepladder are locked into place. Ask someone to hold it for you, if possible. Clearly mark and make sure that you can see: Any grab bars or handrails. First and last steps. Where the edge of each step is. Use tools that help you move around (mobility aids) if they are needed. These include: Canes. Walkers. Scooters. Crutches. Turn on the lights when you go into a dark area. Replace any light bulbs as soon as they burn out. Set up your furniture so you have a clear path. Avoid moving your furniture around. If any of your floors are uneven, fix them. If there are any pets around you, be aware of where they are. Review your medicines with your doctor. Some medicines can make you feel dizzy. This can increase your chance of falling. Ask your doctor what other things that you can do to help prevent falls. This information is not intended to replace advice given to you by your health care provider. Make sure you discuss any questions you have with your health care provider. Document Released: 09/15/2009 Document Revised: 04/26/2016 Document Reviewed: 12/24/2014 Elsevier Interactive Patient Education  2017 [employer].

## 2023-03-13 ENCOUNTER — Ambulatory Visit: Payer: Medicare Other | Admitting: Family Medicine

## 2023-03-19 ENCOUNTER — Other Ambulatory Visit: Payer: Self-pay | Admitting: Family Medicine

## 2023-03-21 ENCOUNTER — Ambulatory Visit (INDEPENDENT_AMBULATORY_CARE_PROVIDER_SITE_OTHER): Payer: Medicare Other | Admitting: Family Medicine

## 2023-03-21 ENCOUNTER — Encounter: Payer: Self-pay | Admitting: Family Medicine

## 2023-03-21 VITALS — BP 153/76 | HR 69 | Temp 98.5°F | Ht 63.5 in | Wt 192.8 lb

## 2023-03-21 DIAGNOSIS — E119 Type 2 diabetes mellitus without complications: Secondary | ICD-10-CM

## 2023-03-21 DIAGNOSIS — E7800 Pure hypercholesterolemia, unspecified: Secondary | ICD-10-CM | POA: Diagnosis not present

## 2023-03-21 DIAGNOSIS — I10 Essential (primary) hypertension: Secondary | ICD-10-CM | POA: Diagnosis not present

## 2023-03-21 LAB — COMPREHENSIVE METABOLIC PANEL
ALT: 21 U/L (ref 0–35)
AST: 21 U/L (ref 0–37)
Albumin: 4.5 g/dL (ref 3.5–5.2)
Alkaline Phosphatase: 77 U/L (ref 39–117)
BUN: 14 mg/dL (ref 6–23)
CO2: 25 mEq/L (ref 19–32)
Calcium: 10.7 mg/dL — ABNORMAL HIGH (ref 8.4–10.5)
Chloride: 101 mEq/L (ref 96–112)
Creatinine, Ser: 0.7 mg/dL (ref 0.40–1.20)
GFR: 83.14 mL/min (ref >60.00)
Glucose, Bld: 111 mg/dL — ABNORMAL HIGH (ref 70–99)
Potassium: 4 mEq/L (ref 3.5–5.1)
Sodium: 138 mEq/L (ref 135–145)
Total Bilirubin: 0.8 mg/dL (ref 0.2–1.2)
Total Protein: 7.2 g/dL (ref 6.0–8.3)

## 2023-03-21 LAB — POCT GLYCOSYLATED HEMOGLOBIN (HGB A1C)
HbA1c POC (<> result, manual entry): 6.3 % (ref 4.0–5.6)
HbA1c, POC (controlled diabetic range): 6.3 % (ref 0.0–7.0)
HbA1c, POC (prediabetic range): 6.3 % (ref 5.7–6.4)
Hemoglobin A1C: 6.3 % — AB (ref 4.0–5.6)

## 2023-03-21 MED ORDER — AMLODIPINE BESYLATE 5 MG PO TABS: 5.0000 mg | ORAL_TABLET | Freq: Every day | ORAL | 1 refills | Status: DC

## 2023-03-21 MED ORDER — ATORVASTATIN CALCIUM 20 MG PO TABS: 20.0000 mg | ORAL_TABLET | Freq: Every day | ORAL | 1 refills | Status: DC

## 2023-03-21 MED ORDER — LISINOPRIL 20 MG PO TABS: 20.0000 mg | ORAL_TABLET | Freq: Every day | ORAL | 1 refills | Status: DC

## 2023-03-21 NOTE — Progress Notes (Signed)
OFFICE VISIT  04/01/2023  CC:  Chief Complaint  Patient presents with   Medical Management of Chronic Issues    Pt is not fasting    Patient is a 77 y.o. female who presents for 7-month follow-up diabetes, hypertension, and hyperlipidemia. A/P as of last visit: "#1 hypertension.  She definitely has a whitecoat component. We will continue amlodipine 5 mg a day and lisinopril 20 mg a day. Electrolytes and creatinine today.   2.  Hypercholesterolemia.  Has been doing well on atorvastatin 20 mg a day. Lipid panel and hepatic panel today.   3.  Diet-controlled diabetes. Well-controlled.  Point-of-care Hemoglobin A1c today was 6.5% (improved from 7% last visit). Feet exam normal today."  INTERIM HX: Catherine Daniel feels well. Her home blood pressures are consistently 130/80 or less. No home glucose monitoring.  ROS --> no fevers, no CP, no SOB, no wheezing, no cough, no dizziness, no HAs, no rashes, no melena/hematochezia.  No polyuria or polydipsia.  No myalgias or arthralgias.  No focal weakness, paresthesias, or tremors.  No acute vision or hearing abnormalities.  No dysuria or unusual/new urinary urgency or frequency.  No recent changes in lower legs. No n/v/d or abd pain.  No palpitations.   . Past Medical History:  Diagnosis Date   COVID-19 virus infection 09/2021   paxlovid   Diabetes mellitus type II    Highest A1c 6.6% in 2014.  Diet-controlled.   Diverticulosis    History of cardiac catheterization 1999   normal   Hyperlipidemia    Hypertension    Obesity, Class II, BMI 35-39.9    Osteoarthritis of both knees    inj on R no help in remote past (ortho in [place] but pt cannot recall name)   Situational anxiety    Flying, heights, dentists: xanax prn works well   Tubular adenoma of colon 2011   At 09/19/16 GI visit she decided against repeat colonoscopy, and decided to pursue annual iFOB testing instead (Dr. Rhea Belton) (iFOB neg 09/2016)    Past Surgical History:  Procedure  Laterality Date   CHOLECYSTECTOMY  1997   for cholelithiasis   COLONOSCOPY  05/31/2010   Descending colon and sigmoid diverticulosis-severe.  Polypectomy x 1.  Recall 5 yrs (Dr. Jarold Motto).  Pt chose not to get repeat colonoscopy due to bad experience with procedure in 2011.  She will do annal iFOB testing---has agreed to do colonoscopy IF one returns positive.   TONSILLECTOMY      Outpatient Medications Prior to Visit  Medication Sig Dispense Refill   Calcium Carb-Cholecalciferol (CALCIUM-VITAMIN D) 600-400 MG-UNIT TABS Take 1 tablet by mouth 2 (two) times daily.     Omega-3 Fatty Acids (FISH OIL) 1000 MG CAPS Take by mouth daily.       amLODipine (NORVASC) 5 MG tablet TAKE 1 TABLET (5 MG TOTAL) BY MOUTH DAILY. 30 tablet 0   atorvastatin (LIPITOR) 20 MG tablet TAKE 1 TABLET BY MOUTH EVERY DAY 30 tablet 0   lisinopril (ZESTRIL) 20 MG tablet TAKE 1 TABLET BY MOUTH EVERY DAY 30 tablet 0   No facility-administered medications prior to visit.    No Known Allergies  Review of Systems As per HPI  PE:    03/21/2023    1:12 PM 02/27/2023   10:51 AM 08/22/2022   10:42 AM  Vitals with BMI  Height 5' 3.5"  5' 3.5"  Weight 192 lbs 13 oz 197 lbs 197 lbs 6 oz  BMI 33.61  34.42  Systolic 153  150  Diastolic 76  78  Pulse 69  72     Physical Exam  Gen: Alert, well appearing.  Patient is oriented to person, place, time, and situation. AFFECT: pleasant, lucid thought and speech. CV: RRR, no m/r/g.   LUNGS: CTA bilat, nonlabored resps, good aeration in all lung fields. EXT: no clubbing or cyanosis.  no edema.    LABS:  Last CBC Lab Results  Component Value Date   WBC 6.6 01/11/2016   HGB 14.5 01/11/2016   HCT 44.7 01/11/2016   MCV 81.1 01/11/2016   RDW 14.6 01/11/2016   PLT 136.0 (L) 01/11/2016   Last metabolic panel Lab Results  Component Value Date   GLUCOSE 111 (H) 03/21/2023   NA 138 03/21/2023   K 4.0 03/21/2023   CL 101 03/21/2023   CO2 25 03/21/2023   BUN 14  03/21/2023   CREATININE 0.70 03/21/2023   CALCIUM 10.7 (H) 03/21/2023   PROT 7.2 03/21/2023   ALBUMIN 4.5 03/21/2023   BILITOT 0.8 03/21/2023   ALKPHOS 77 03/21/2023   AST 21 03/21/2023   ALT 21 03/21/2023   Last lipids Lab Results  Component Value Date   CHOL 165 08/22/2022   HDL 47.00 08/22/2022   LDLCALC 93 08/22/2022   TRIG 125.0 08/22/2022   CHOLHDL 4 08/22/2022   Last hemoglobin A1c Lab Results  Component Value Date   HGBA1C 6.3 (A) 03/21/2023   HGBA1C 6.3 03/21/2023   HGBA1C 6.3 03/21/2023   HGBA1C 6.3 03/21/2023   Last thyroid functions Lab Results  Component Value Date   TSH 1.61 01/11/2016   IMPRESSION AND PLAN:  #1 hypertension, well-controlled on amlodipine 5 mg a day and lisinopril 20 mg a day. Electrolytes and creatinine today.  #2 hypercholesterolemia, well-controlled on Lipitor 20 mg a day. Last LDL was 93 about 7 months ago. She is not fasting today. Defer lipid panel until next follow-up.  Check hepatic panel today.  3.  Diet-controlled diabetes.  Hemoglobin A1c stable at 6.3% today.  Continue good diet and exercise habits.  #4 preventative health: She continues to decline all screening (mammogram, DEXA, Hep C, pap smears, colon ca screening)   An After Visit Summary was printed and given to the patient.  FOLLOW UP: Return in about 6 months (around 09/20/2023) for routine chronic illness f/u.  Signed:  Santiago Bumpers, MD           04/01/2023

## 2023-07-25 ENCOUNTER — Other Ambulatory Visit: Payer: Self-pay | Admitting: Family Medicine

## 2023-07-25 DIAGNOSIS — Z1231 Encounter for screening mammogram for malignant neoplasm of breast: Secondary | ICD-10-CM

## 2023-08-10 IMAGING — MG MM DIGITAL SCREENING BILAT W/ TOMO AND CAD
8 series · 8 of 24 positions shown · non-contrast
Comparison: Previous exam(s).

CLINICAL DATA: Screening.

EXAM:
DIGITAL SCREENING BILATERAL MAMMOGRAM WITH TOMOSYNTHESIS AND CAD
TECHNIQUE: Bilateral screening digital craniocaudal and mediolateral oblique
mammograms were obtained. Bilateral screening digital breast
tomosynthesis was performed. The images were evaluated with
computer-aided detection.

[R MLO synth-2D]
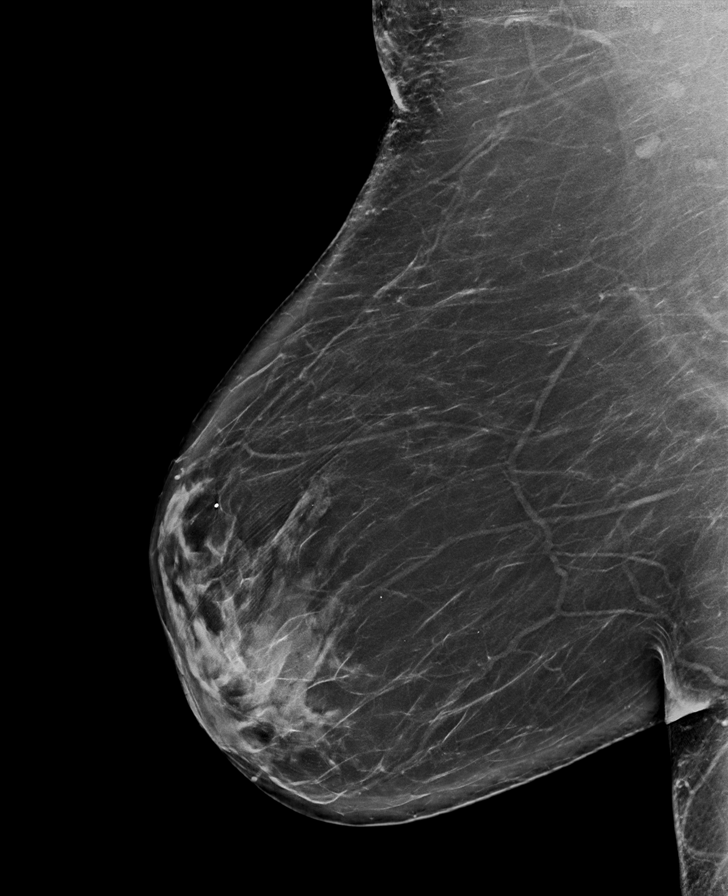

[L MLO synth-2D]
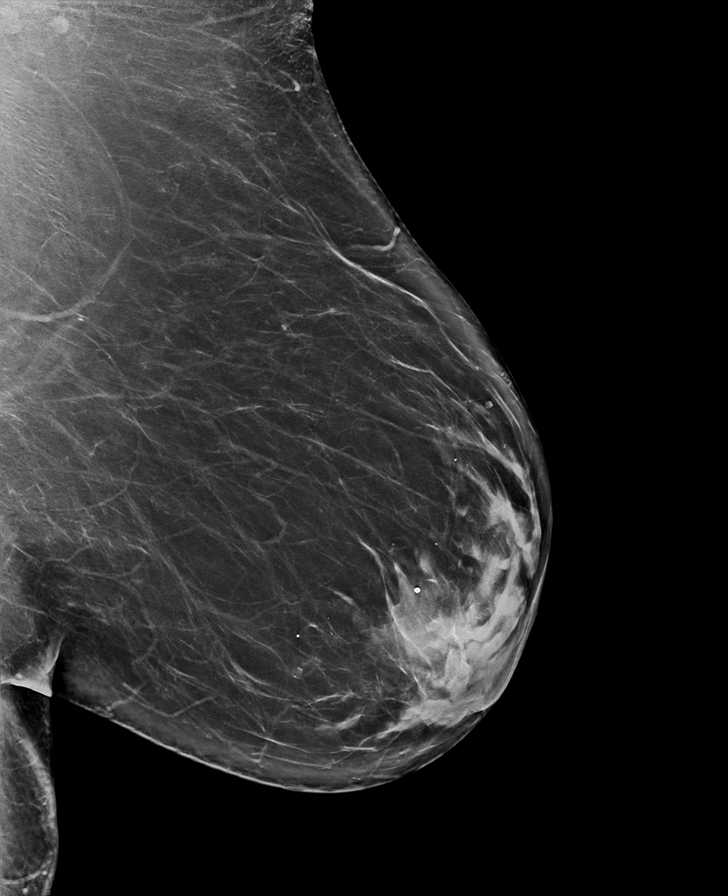

[R CC synth-2D]
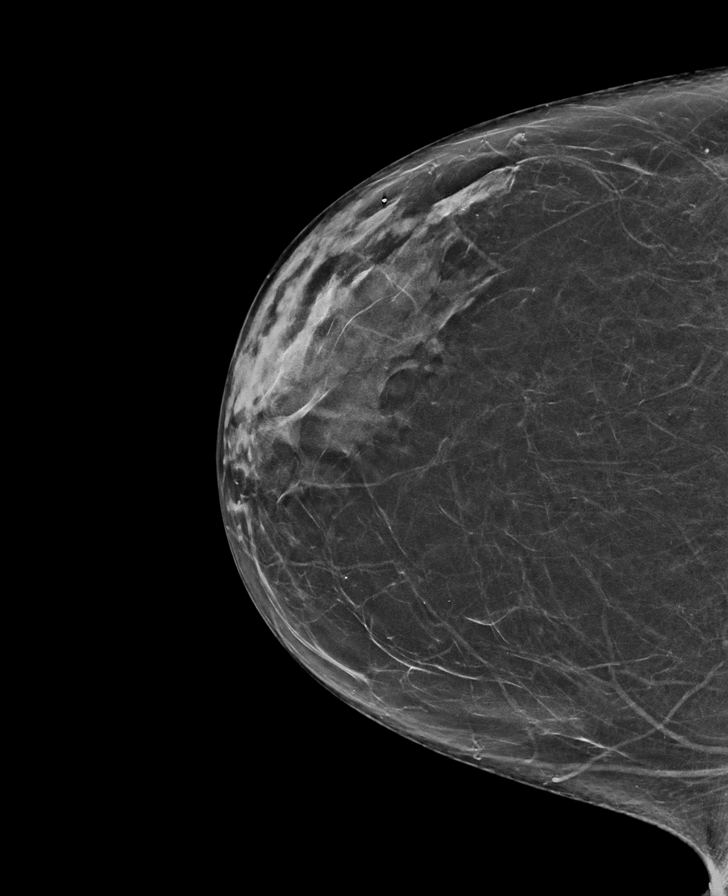

[L CC synth-2D]
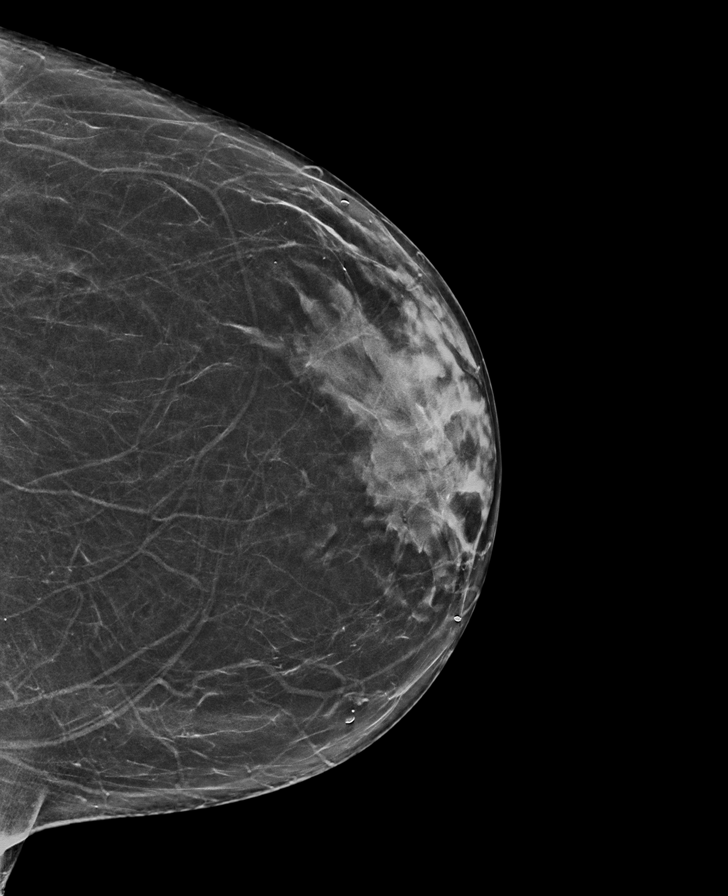

[L CC tomo · tomo slice 37/72.0]
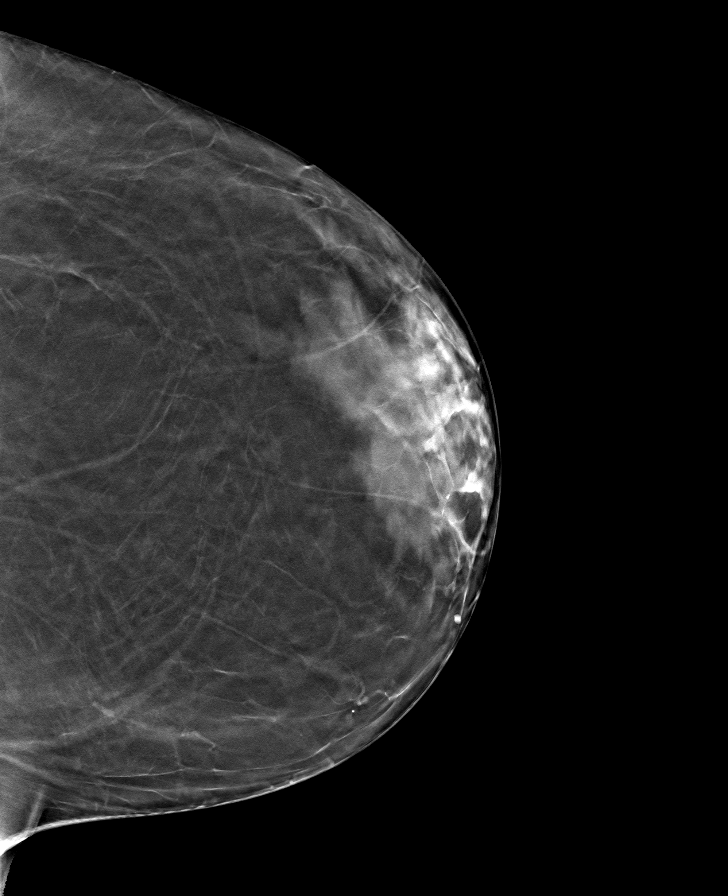

[R CC tomo · tomo slice 36/71.0]
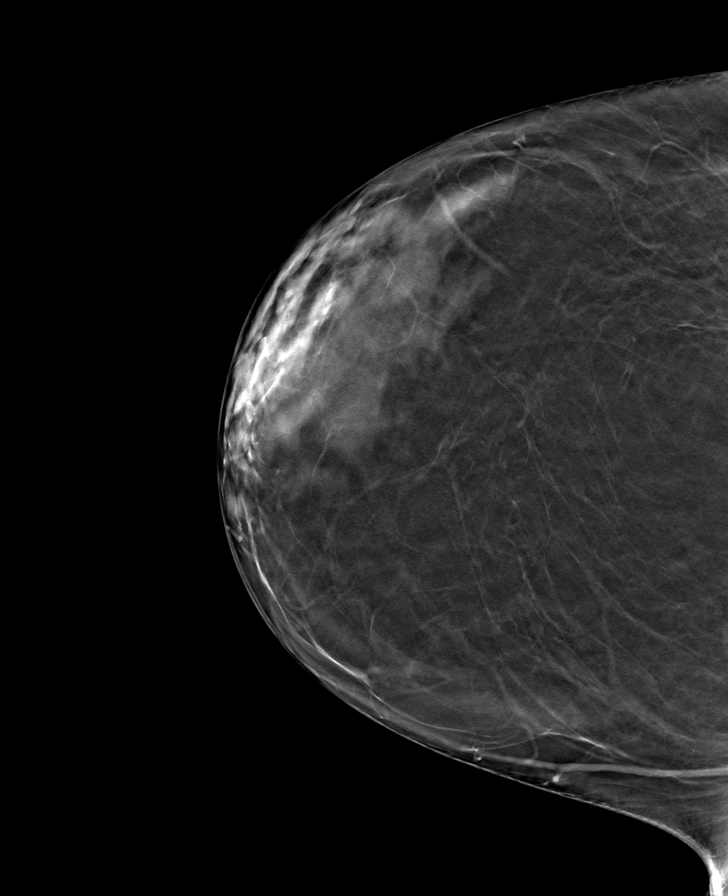

[R MLO tomo · tomo slice 46/91.0]
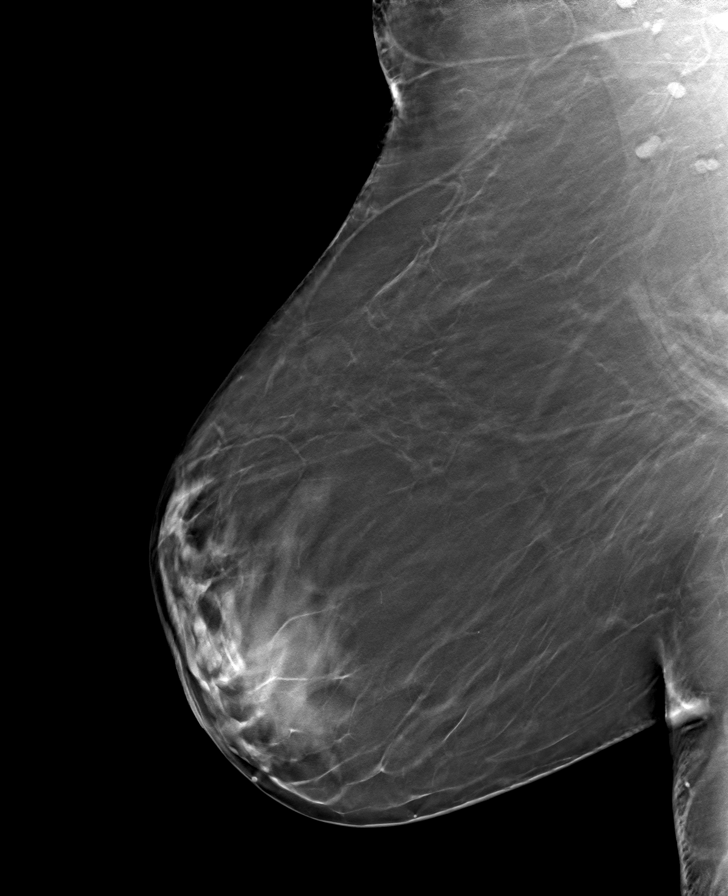

[L MLO tomo · tomo slice 44/87.0]
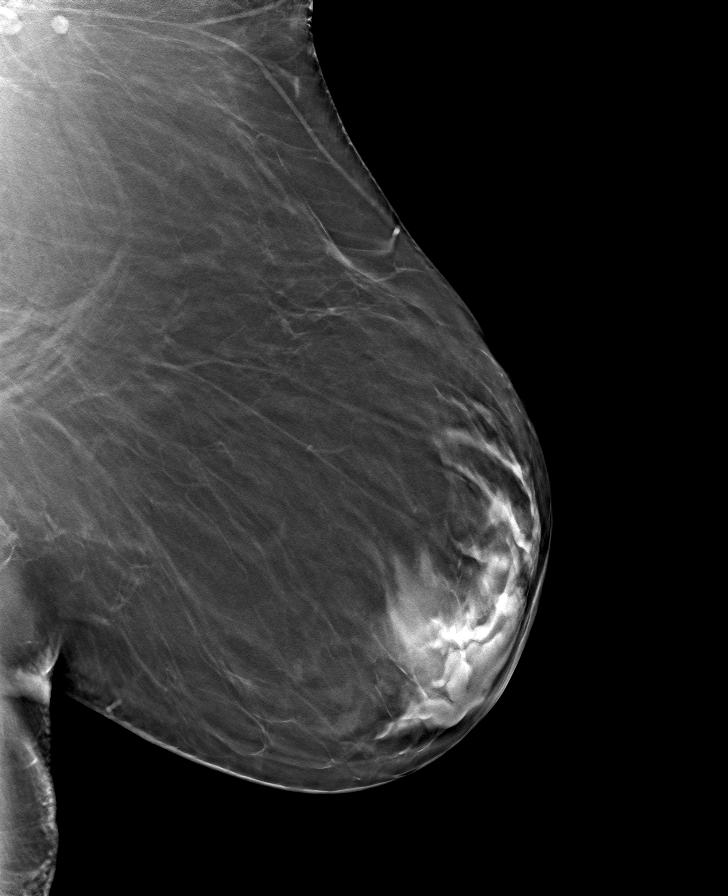

[8 of 24 positions shown; findings below may reference images not displayed]

ACR Breast Density Category b: There are scattered areas of
fibroglandular density.
FINDINGS: There are no findings suspicious for malignancy.
IMPRESSION: No mammographic evidence of malignancy. A result letter of this
screening mammogram will be mailed directly to the patient.

RECOMMENDATION:
Screening mammogram in one year. (Code:51-O-LD2)

BI-RADS CATEGORY  1: Negative.

## 2023-09-04 ENCOUNTER — Ambulatory Visit: Payer: Medicare Other

## 2023-09-04 DIAGNOSIS — Z1231 Encounter for screening mammogram for malignant neoplasm of breast: Secondary | ICD-10-CM | POA: Diagnosis not present

## 2023-09-17 ENCOUNTER — Other Ambulatory Visit: Payer: Self-pay | Admitting: Family Medicine

## 2023-09-17 DIAGNOSIS — I10 Essential (primary) hypertension: Secondary | ICD-10-CM

## 2023-09-17 NOTE — Telephone Encounter (Signed)
Pt has appt on 10/18.

## 2023-09-20 ENCOUNTER — Ambulatory Visit: Payer: Medicare Other | Admitting: Family Medicine

## 2023-09-20 DIAGNOSIS — E7800 Pure hypercholesterolemia, unspecified: Secondary | ICD-10-CM

## 2023-09-20 DIAGNOSIS — I10 Essential (primary) hypertension: Secondary | ICD-10-CM

## 2023-09-20 DIAGNOSIS — E119 Type 2 diabetes mellitus without complications: Secondary | ICD-10-CM

## 2023-09-20 MED ORDER — LISINOPRIL 20 MG PO TABS
20.0000 mg | ORAL_TABLET | Freq: Every day | ORAL | 3 refills | Status: DC
Start: 2023-09-20 — End: 2023-09-25

## 2023-09-20 MED ORDER — AMLODIPINE BESYLATE 5 MG PO TABS
5.0000 mg | ORAL_TABLET | Freq: Every day | ORAL | 0 refills | Status: DC
Start: 2023-09-20 — End: 2023-09-25

## 2023-09-20 NOTE — Addendum Note (Signed)
Addended by: Arty Baumgartner A on: 09/20/2023 08:10 AM   Modules accepted: Orders

## 2023-09-25 ENCOUNTER — Encounter: Payer: Self-pay | Admitting: Family Medicine

## 2023-09-25 ENCOUNTER — Ambulatory Visit (INDEPENDENT_AMBULATORY_CARE_PROVIDER_SITE_OTHER): Payer: Medicare Other | Admitting: Family Medicine

## 2023-09-25 VITALS — BP 132/67 | HR 67 | Wt 192.0 lb

## 2023-09-25 DIAGNOSIS — E119 Type 2 diabetes mellitus without complications: Secondary | ICD-10-CM | POA: Diagnosis not present

## 2023-09-25 DIAGNOSIS — I10 Essential (primary) hypertension: Secondary | ICD-10-CM

## 2023-09-25 DIAGNOSIS — E7800 Pure hypercholesterolemia, unspecified: Secondary | ICD-10-CM

## 2023-09-25 LAB — POCT GLYCOSYLATED HEMOGLOBIN (HGB A1C)
HbA1c POC (<> result, manual entry): 6.5 % (ref 4.0–5.6)
HbA1c, POC (controlled diabetic range): 6.5 % (ref 0.0–7.0)
HbA1c, POC (prediabetic range): 6.5 % — AB (ref 5.7–6.4)
Hemoglobin A1C: 6.5 % — AB (ref 4.0–5.6)

## 2023-09-25 MED ORDER — LISINOPRIL 20 MG PO TABS: 20.0000 mg | ORAL_TABLET | Freq: Every day | ORAL | 1 refills | Status: DC

## 2023-09-25 MED ORDER — ATORVASTATIN CALCIUM 20 MG PO TABS: 20.0000 mg | ORAL_TABLET | Freq: Every day | ORAL | 1 refills | Status: DC

## 2023-09-25 MED ORDER — AMLODIPINE BESYLATE 5 MG PO TABS: 5.0000 mg | ORAL_TABLET | Freq: Every day | ORAL | 1 refills | Status: DC

## 2023-09-25 NOTE — Progress Notes (Signed)
OFFICE VISIT  09/25/2023  CC:  Chief Complaint  Patient presents with   Medical Management of Chronic Issues    Patient is a 77 y.o. female who presents for 31-month follow-up diabetes, hypertension, and hyperlipidemia. A/P as of last visit: "#1 hypertension, well-controlled on amlodipine 5 mg a day and lisinopril 20 mg a day. Electrolytes and creatinine today.   #2 hypercholesterolemia, well-controlled on Lipitor 20 mg a day. Last LDL was 93 about 7 months ago. She is not fasting today. Defer lipid panel until next follow-up.  Check hepatic panel today.   3.  Diet-controlled diabetes.  Hemoglobin A1c stable at 6.3% today.  Continue good diet and exercise habits.   #4 preventative health: She continues to decline all screening (mammogram, DEXA, Hep C, pap smears, colon ca screening) "  INTERIM HX: Catherine Daniel feels well. She walks regularly during her day, usually when she goes shopping. Her son has come into town today from [place] and she is looking forward to spending time with him. Husband Homero Fellers is doing well.  ROS as above, plus--> no fevers, no CP, no SOB, no wheezing, no cough, no dizziness, no HAs, no rashes, no melena/hematochezia.  No polyuria or polydipsia.  No myalgias or arthralgias.  No focal weakness, paresthesias, or tremors.  No acute vision or hearing abnormalities.  No dysuria or unusual/new urinary urgency or frequency.  No recent changes in lower legs. No n/v/d or abd pain.  No palpitations.     Past Medical History:  Diagnosis Date   COVID-19 virus infection 09/2021   paxlovid   Diabetes mellitus type II    Highest A1c 6.6% in 2014.  Diet-controlled.   Diverticulosis    History of cardiac catheterization 1999   normal   Hyperlipidemia    Hypertension    Obesity, Class II, BMI 35-39.9    Osteoarthritis of both knees    inj on R no help in remote past (ortho in [place] but pt cannot recall name)   Situational anxiety    Flying, heights, dentists: xanax prn  works well   Tubular adenoma of colon 2011   At 09/19/16 GI visit she decided against repeat colonoscopy, and decided to pursue annual iFOB testing instead (Dr. Rhea Belton) (iFOB neg 09/2016)    Past Surgical History:  Procedure Laterality Date   CHOLECYSTECTOMY  1997   for cholelithiasis   COLONOSCOPY  05/31/2010   Descending colon and sigmoid diverticulosis-severe.  Polypectomy x 1.  Recall 5 yrs (Dr. Jarold Motto).  Pt chose not to get repeat colonoscopy due to bad experience with procedure in 2011.  She will do annal iFOB testing---has agreed to do colonoscopy IF one returns positive.   TONSILLECTOMY      Outpatient Medications Prior to Visit  Medication Sig Dispense Refill   Calcium Carb-Cholecalciferol (CALCIUM-VITAMIN D) 600-400 MG-UNIT TABS Take 1 tablet by mouth 2 (two) times daily.     Omega-3 Fatty Acids (FISH OIL) 1000 MG CAPS Take by mouth daily.       amLODipine (NORVASC) 5 MG tablet Take 1 tablet (5 mg total) by mouth daily. 90 tablet 1   amLODipine (NORVASC) 5 MG tablet Take 1 tablet (5 mg total) by mouth daily. 30 tablet 0   atorvastatin (LIPITOR) 20 MG tablet Take 1 tablet (20 mg total) by mouth daily. 90 tablet 1   lisinopril (ZESTRIL) 20 MG tablet Take 1 tablet (20 mg total) by mouth daily. 90 tablet 1   lisinopril (ZESTRIL) 20 MG tablet Take 1  tablet (20 mg total) by mouth daily. 90 tablet 3   No facility-administered medications prior to visit.    No Known Allergies  Review of Systems As per HPI  PE:    09/25/2023    2:02 PM 09/25/2023    2:01 PM 03/21/2023    1:12 PM  Vitals with BMI  Height   5' 3.5"  Weight  192 lbs 192 lbs 13 oz  BMI   33.61  Systolic 132 164 098  Diastolic 67 83 76  Pulse  67 69    Physical Exam  Gen: Alert, well appearing.  Patient is oriented to person, place, time, and situation. AFFECT: pleasant, lucid thought and speech. CV: RRR, no m/r/g.   LUNGS: CTA bilat, nonlabored resps, good aeration in all lung fields. EXT: no  clubbing or cyanosis.  no edema.  Foot exam - no swelling, tenderness or skin or vascular lesions. Color and temperature is normal. Sensation is intact. Peripheral pulses are palpable. Toenails are discolored and hypertrophic.   LABS:  Last CBC Lab Results  Component Value Date   WBC 6.6 01/11/2016   HGB 14.5 01/11/2016   HCT 44.7 01/11/2016   MCV 81.1 01/11/2016   RDW 14.6 01/11/2016   PLT 136.0 (L) 01/11/2016   Last metabolic panel Lab Results  Component Value Date   GLUCOSE 111 (H) 03/21/2023   NA 138 03/21/2023   K 4.0 03/21/2023   CL 101 03/21/2023   CO2 25 03/21/2023   BUN 14 03/21/2023   CREATININE 0.70 03/21/2023   GFR 83.14 03/21/2023   CALCIUM 10.7 (H) 03/21/2023   PROT 7.2 03/21/2023   ALBUMIN 4.5 03/21/2023   BILITOT 0.8 03/21/2023   ALKPHOS 77 03/21/2023   AST 21 03/21/2023   ALT 21 03/21/2023   Last lipids Lab Results  Component Value Date   CHOL 165 08/22/2022   HDL 47.00 08/22/2022   LDLCALC 93 08/22/2022   TRIG 125.0 08/22/2022   CHOLHDL 4 08/22/2022   Last hemoglobin A1c Lab Results  Component Value Date   HGBA1C 6.5 (A) 09/25/2023   HGBA1C 6.5 09/25/2023   HGBA1C 6.5 (A) 09/25/2023   HGBA1C 6.5 09/25/2023   Last thyroid functions Lab Results  Component Value Date   TSH 1.61 01/11/2016   IMPRESSION AND PLAN:  #1 diabetes without complication, diet controlled. Point-of-care hemoglobin A1c is 6.5% today. Feet exam today normal. Urine microalbumin/creatinine today.  2.  Hypertension, well-controlled on amlodipine 5 mg a day and lisinopril 20 mg a day. Electrolytes and creatinine monitoring today.  Hypercholesterolemia, doing well long-term on a atorvastatin 20 mg a day. Lipid panel today.  3. preventative health:  Mammogram UTD 09/04/23. She continues to decline all other screening ( DEXA, Hep C, pap smears, colon ca screening) and vaccines.  An After Visit Summary was printed and given to the patient.  FOLLOW UP: Return in  about 6 months (around 03/25/2024) for routine chronic illness f/u .  Signed:  Santiago Bumpers, MD           09/25/2023

## 2023-09-26 LAB — LIPID PANEL
Cholesterol: 177 mg/dL (ref 0–200)
HDL: 55.1 mg/dL (ref >39.00)
LDL Cholesterol: 96 mg/dL (ref 0–99)
NonHDL: 122.23
Total CHOL/HDL Ratio: 3
Triglycerides: 130 mg/dL (ref 0.0–149.0)
VLDL: 26 mg/dL (ref 0.0–40.0)

## 2023-09-26 LAB — BASIC METABOLIC PANEL
BUN: 15 mg/dL (ref 6–23)
CO2: 28 meq/L (ref 19–32)
Calcium: 11.1 mg/dL — ABNORMAL HIGH (ref 8.4–10.5)
Chloride: 101 meq/L (ref 96–112)
Creatinine, Ser: 0.77 mg/dL (ref 0.40–1.20)
GFR: 73.89 mL/min (ref >60.00)
Glucose, Bld: 114 mg/dL — ABNORMAL HIGH (ref 70–99)
Potassium: 4.6 meq/L (ref 3.5–5.1)
Sodium: 138 meq/L (ref 135–145)

## 2023-09-27 ENCOUNTER — Other Ambulatory Visit: Payer: Medicare Other

## 2023-09-27 ENCOUNTER — Other Ambulatory Visit: Payer: Self-pay

## 2023-09-27 ENCOUNTER — Encounter: Payer: Self-pay | Admitting: Family Medicine

## 2023-09-30 LAB — PHOSPHORUS: Phosphorus: 3.7 mg/dL (ref 2.1–4.3)

## 2023-09-30 LAB — PTH, INTACT AND CALCIUM
Calcium: 11 mg/dL — ABNORMAL HIGH (ref 8.6–10.4)
PTH: 11 mg/dL (ref 8.6–10.4)

## 2023-09-30 LAB — VITAMIN D 25 HYDROXY (VIT D DEFICIENCY, FRACTURES): Vit D, 25-Hydroxy: 35 ng/mL (ref 30–100)

## 2023-10-07 ENCOUNTER — Other Ambulatory Visit (INDEPENDENT_AMBULATORY_CARE_PROVIDER_SITE_OTHER): Payer: Medicare Other

## 2023-10-07 ENCOUNTER — Other Ambulatory Visit: Payer: Medicare Other

## 2023-10-07 DIAGNOSIS — E119 Type 2 diabetes mellitus without complications: Secondary | ICD-10-CM

## 2023-10-07 LAB — MICROALBUMIN / CREATININE URINE RATIO
Creatinine,U: 122 mg/dL
Microalb Creat Ratio: 1 mg/g (ref 0.0–30.0)
Microalb, Ur: 1.3 mg/dL (ref 0.0–1.9)

## 2023-10-08 ENCOUNTER — Encounter: Payer: Self-pay | Admitting: Family Medicine

## 2023-10-08 LAB — PTH, INTACT AND CALCIUM
Calcium: 10.8 mg/dL — ABNORMAL HIGH (ref 8.6–10.4)
PTH: 74 pg/mL (ref 16–77)

## 2023-10-17 DIAGNOSIS — H0100A Unspecified blepharitis right eye, upper and lower eyelids: Secondary | ICD-10-CM | POA: Diagnosis not present

## 2023-10-17 DIAGNOSIS — H2513 Age-related nuclear cataract, bilateral: Secondary | ICD-10-CM | POA: Diagnosis not present

## 2023-10-17 DIAGNOSIS — R7303 Prediabetes: Secondary | ICD-10-CM | POA: Diagnosis not present

## 2023-10-17 DIAGNOSIS — H524 Presbyopia: Secondary | ICD-10-CM | POA: Diagnosis not present

## 2023-10-17 DIAGNOSIS — H43393 Other vitreous opacities, bilateral: Secondary | ICD-10-CM | POA: Diagnosis not present

## 2023-10-17 DIAGNOSIS — H0100B Unspecified blepharitis left eye, upper and lower eyelids: Secondary | ICD-10-CM | POA: Diagnosis not present

## 2023-10-17 DIAGNOSIS — H5203 Hypermetropia, bilateral: Secondary | ICD-10-CM | POA: Diagnosis not present

## 2023-10-17 DIAGNOSIS — H52203 Unspecified astigmatism, bilateral: Secondary | ICD-10-CM | POA: Diagnosis not present

## 2023-10-17 DIAGNOSIS — H25013 Cortical age-related cataract, bilateral: Secondary | ICD-10-CM | POA: Diagnosis not present

## 2023-10-17 LAB — HM DIABETES EYE EXAM

## 2024-01-13 ENCOUNTER — Other Ambulatory Visit: Payer: Self-pay | Admitting: Family Medicine

## 2024-03-04 ENCOUNTER — Ambulatory Visit (INDEPENDENT_AMBULATORY_CARE_PROVIDER_SITE_OTHER): Payer: Medicare Other | Admitting: *Deleted

## 2024-03-04 DIAGNOSIS — Z0000 Encounter for general adult medical examination without abnormal findings: Secondary | ICD-10-CM | POA: Diagnosis not present

## 2024-03-04 NOTE — Patient Instructions (Signed)
 Ms. Catherine Daniel , Thank you for taking time to come for your Medicare Wellness Visit. I appreciate your ongoing commitment to your health goals. Please review the following plan we discussed and let me know if I can assist you in the future.   Screening recommendations/referrals: Colonoscopy: no longer required Mammogram:  Bone Density: Education provided Recommended yearly ophthalmology/optometry visit for glaucoma screening and checkup Recommended yearly dental visit for hygiene and checkup  Vaccinations: Influenza vaccine: up to date Pneumococcal vaccine: up to date Tdap vaccine: up to date Shingles vaccine: up to date       Preventive Care 65 Years and Older, Female Preventive care refers to lifestyle choices and visits with your health care provider that can promote health and wellness. What does preventive care include? A yearly physical exam. This is also called an annual well check. Dental exams once or twice a year. Routine eye exams. Ask your health care provider how often you should have your eyes checked. Personal lifestyle choices, including: Daily care of your teeth and gums. Regular physical activity. Eating a healthy diet. Avoiding tobacco and drug use. Limiting alcohol use. Practicing safe sex. Taking low-dose aspirin every day. Taking vitamin and mineral supplements as recommended by your health care provider. What happens during an annual well check? The services and screenings done by your health care provider during your annual well check will depend on your age, overall health, lifestyle risk factors, and family history of disease. Counseling  Your health care provider may ask you questions about your: Alcohol use. Tobacco use. Drug use. Emotional well-being. Home and relationship well-being. Sexual activity. Eating habits. History of falls. Memory and ability to understand (cognition). Work and work Astronomer. Reproductive health. Screening  You  may have the following tests or measurements: Height, weight, and BMI. Blood pressure. Lipid and cholesterol levels. These may be checked every 5 years, or more frequently if you are over 37 years old. Skin check. Lung cancer screening. You may have this screening every year starting at age 75 if you have a 30-pack-year history of smoking and currently smoke or have quit within the past 15 years. Fecal occult blood test (FOBT) of the stool. You may have this test every year starting at age 62. Flexible sigmoidoscopy or colonoscopy. You may have a sigmoidoscopy every 5 years or a colonoscopy every 10 years starting at age 18. Hepatitis C blood test. Hepatitis B blood test. Sexually transmitted disease (STD) testing. Diabetes screening. This is done by checking your blood sugar (glucose) after you have not eaten for a while (fasting). You may have this done every 1-3 years. Bone density scan. This is done to screen for osteoporosis. You may have this done starting at age 24. Mammogram. This may be done every 1-2 years. Talk to your health care provider about how often you should have regular mammograms. Talk with your health care provider about your test results, treatment options, and if necessary, the need for more tests. Vaccines  Your health care provider may recommend certain vaccines, such as: Influenza vaccine. This is recommended every year. Tetanus, diphtheria, and acellular pertussis (Tdap, Td) vaccine. You may need a Td booster every 10 years. Zoster vaccine. You may need this after age 81. Pneumococcal 13-valent conjugate (PCV13) vaccine. One dose is recommended after age 25. Pneumococcal polysaccharide (PPSV23) vaccine. One dose is recommended after age 56. Talk to your health care provider about which screenings and vaccines you need and how often you need them. This information is  not intended to replace advice given to you by your health care provider. Make sure you discuss any  questions you have with your health care provider. Document Released: 12/16/2015 Document Revised: 08/08/2016 Document Reviewed: 09/20/2015 Elsevier Interactive Patient Education  2017 [employer].  Fall Prevention in the Home Falls can cause injuries. They can happen to people of all ages. There are many things you can do to make your home safe and to help prevent falls. What can I do on the outside of my home? Regularly fix the edges of walkways and driveways and fix any cracks. Remove anything that might make you trip as you walk through a door, such as a raised step or threshold. Trim any bushes or trees on the path to your home. Use bright outdoor lighting. Clear any walking paths of anything that might make someone trip, such as rocks or tools. Regularly check to see if handrails are loose or broken. Make sure that both sides of any steps have handrails. Any raised decks and porches should have guardrails on the edges. Have any leaves, snow, or ice cleared regularly. Use sand or salt on walking paths during winter. Clean up any spills in your garage right away. This includes oil or grease spills. What can I do in the bathroom? Use night lights. Install grab bars by the toilet and in the tub and shower. Do not use towel bars as grab bars. Use non-skid mats or decals in the tub or shower. If you need to sit down in the shower, use a plastic, non-slip stool. Keep the floor dry. Clean up any water that spills on the floor as soon as it happens. Remove soap buildup in the tub or shower regularly. Attach bath mats securely with double-sided non-slip rug tape. Do not have throw rugs and other things on the floor that can make you trip. What can I do in the bedroom? Use night lights. Make sure that you have a light by your bed that is easy to reach. Do not use any sheets or blankets that are too big for your bed. They should not hang down onto the floor. Have a firm chair that has side  arms. You can use this for support while you get dressed. Do not have throw rugs and other things on the floor that can make you trip. What can I do in the kitchen? Clean up any spills right away. Avoid walking on wet floors. Keep items that you use a lot in easy-to-reach places. If you need to reach something above you, use a strong step stool that has a grab bar. Keep electrical cords out of the way. Do not use floor polish or wax that makes floors slippery. If you must use wax, use non-skid floor wax. Do not have throw rugs and other things on the floor that can make you trip. What can I do with my stairs? Do not leave any items on the stairs. Make sure that there are handrails on both sides of the stairs and use them. Fix handrails that are broken or loose. Make sure that handrails are as long as the stairways. Check any carpeting to make sure that it is firmly attached to the stairs. Fix any carpet that is loose or worn. Avoid having throw rugs at the top or bottom of the stairs. If you do have throw rugs, attach them to the floor with carpet tape. Make sure that you have a light switch at the top of the  stairs and the bottom of the stairs. If you do not have them, ask someone to add them for you. What else can I do to help prevent falls? Wear shoes that: Do not have high heels. Have rubber bottoms. Are comfortable and fit you well. Are closed at the toe. Do not wear sandals. If you use a stepladder: Make sure that it is fully opened. Do not climb a closed stepladder. Make sure that both sides of the stepladder are locked into place. Ask someone to hold it for you, if possible. Clearly mark and make sure that you can see: Any grab bars or handrails. First and last steps. Where the edge of each step is. Use tools that help you move around (mobility aids) if they are needed. These include: Canes. Walkers. Scooters. Crutches. Turn on the lights when you go into a dark area.  Replace any light bulbs as soon as they burn out. Set up your furniture so you have a clear path. Avoid moving your furniture around. If any of your floors are uneven, fix them. If there are any pets around you, be aware of where they are. Review your medicines with your doctor. Some medicines can make you feel dizzy. This can increase your chance of falling. Ask your doctor what other things that you can do to help prevent falls. This information is not intended to replace advice given to you by your health care provider. Make sure you discuss any questions you have with your health care provider. Document Released: 09/15/2009 Document Revised: 04/26/2016 Document Reviewed: 12/24/2014 Elsevier Interactive Patient Education  2017 [employer].

## 2024-03-04 NOTE — Progress Notes (Signed)
 Subjective:   Catherine Daniel is a 78 y.o. female who presents for Medicare Annual (Subsequent) preventive examination.  Visit Complete: Virtual I connected with  Chiara Coltrin on 03/04/24 by a audio enabled telemedicine application and verified that I am speaking with the correct person using two identifiers.  Patient Location: Home  Provider Location: Home Office  I discussed the limitations of evaluation and management by telemedicine. The patient expressed understanding and agreed to proceed.  Vital Signs: Because this visit was a virtual/telehealth visit, some criteria may be missing or patient reported. Any vitals not documented were not able to be obtained and vitals that have been documented are patient reported.  Cardiac Risk Factors include: advanced age (>6men, >71 women);hypertension     Objective:    There were no vitals filed for this visit. There is no height or weight on file to calculate BMI.     03/04/2024   10:56 AM 02/27/2023   10:57 AM 02/08/2022    3:48 PM  Advanced Directives  Does Patient Have a Medical Advance Directive? No No No  Would patient like information on creating a medical advance directive? No - Patient declined No - Patient declined No - Patient declined    Current Medications (verified) Outpatient Encounter Medications as of 03/04/2024  Medication Sig   amLODipine (NORVASC) 5 MG tablet Take 1 tablet (5 mg total) by mouth daily.   atorvastatin (LIPITOR) 20 MG tablet Take 1 tablet (20 mg total) by mouth daily.   Calcium Carb-Cholecalciferol (CALCIUM-VITAMIN D) 600-400 MG-UNIT TABS Take 1 tablet by mouth 2 (two) times daily.   lisinopril (ZESTRIL) 20 MG tablet Take 1 tablet (20 mg total) by mouth daily.   magnesium oxide (MAG-OX) 400 (240 Mg) MG tablet Take 400 mg by mouth daily.   Omega-3 Fatty Acids (FISH OIL) 1000 MG CAPS Take by mouth daily.     No facility-administered encounter medications on file as of 03/04/2024.    Allergies  (verified) Patient has no known allergies.   History: Past Medical History:  Diagnosis Date   Diabetes mellitus type II    Highest A1c 6.6% in 2014.  Diet-controlled.   Diverticulosis    History of cardiac catheterization 1999   normal   Hypercalcemia    PTH normal 10/2024   Hyperlipidemia    Hypertension    Obesity, Class II, BMI 35-39.9    Osteoarthritis of both knees    inj on R no help in remote past (ortho in [place] but pt cannot recall name)   Situational anxiety    Flying, heights, dentists: xanax prn works well   Tubular adenoma of colon 2011   At 09/19/16 GI visit she decided against repeat colonoscopy, and decided to pursue annual iFOB testing instead (Dr. Rhea Belton) (iFOB neg 09/2016)   Past Surgical History:  Procedure Laterality Date   CHOLECYSTECTOMY  1997   for cholelithiasis   COLONOSCOPY  05/31/2010   Descending colon and sigmoid diverticulosis-severe.  Polypectomy x 1.  Recall 5 yrs (Dr. Jarold Motto).  Pt chose not to get repeat colonoscopy due to bad experience with procedure in 2011.  She will do annal iFOB testing---has agreed to do colonoscopy IF one returns positive.   TONSILLECTOMY     Family History  Problem Relation Age of Onset   Hyperlipidemia Mother    Coronary artery disease Mother    Diabetes Sister    Social History   Socioeconomic History   Marital status: Married    Spouse name: Not  on file   Number of children: 2   Years of education: Not on file   Highest education level: Not on file  Occupational History   Occupation: housewife  Tobacco Use   Smoking status: Former    Current packs/day: 0.00    Types: Cigarettes    Quit date: 12/03/1989    Years since quitting: 34.2   Smokeless tobacco: Never  Vaping Use   Vaping status: Never Used  Substance and Sexual Activity   Alcohol use: Yes    Comment: wine once in a while   Drug use: No   Sexual activity: Not Currently  Other Topics Concern   Not on file  Social History Narrative    Married, 2 sons.   Orig from [place], now lives in [place].   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   Tob: 20 pack-yr hx, quit 1990.  Alcohol: none.   No formal exercise.   Social Drivers of Corporate investment banker Strain: Low Risk  (03/04/2024)   Overall Financial Resource Strain (CARDIA)    Difficulty of Paying Living Expenses: Not hard at all  Food Insecurity: No Food Insecurity (03/04/2024)   Hunger Vital Sign    Worried About Running Out of Food in the Last Year: Never true    Ran Out of Food in the Last Year: Never true  Transportation Needs: No Transportation Needs (03/04/2024)   PRAPARE - Administrator, Civil Service (Medical): No    Lack of Transportation (Non-Medical): No  Physical Activity: Inactive (03/04/2024)   Exercise Vital Sign    Days of Exercise per Week: 0 days    Minutes of Exercise per Session: 0 min  Stress: No Stress Concern Present (03/04/2024)   [employer] of Occupational Health - Occupational Stress Questionnaire    Feeling of Stress : Not at all  Social Connections: Moderately Isolated (03/04/2024)   Social Connection and Isolation Panel [NHANES]    Frequency of Communication with Friends and Family: More than three times a week    Frequency of Social Gatherings with Friends and Family: More than three times a week    Attends Religious Services: Never    Database administrator or Organizations: No    Attends Engineer, structural: Never    Marital Status: Married    Tobacco Counseling Counseling given: Not Answered   Clinical Intake:  Pre-visit preparation completed: Yes  Pain : No/denies pain     Diabetes: No  How often do you need to have someone help you when you read instructions, pamphlets, or other written materials from your doctor or pharmacy?: 1 - Never  Interpreter Needed?: No  Information entered by :: Remi Haggard LPN   Activities of Daily Living    03/04/2024   10:57 AM  In  your present state of health, do you have any difficulty performing the following activities:  Hearing? 0  Vision? 0  Difficulty concentrating or making decisions? 0  Walking or climbing stairs? 1  Dressing or bathing? 0  Doing errands, shopping? 0  Preparing Food and eating ? N  Using the Toilet? N  In the past six months, have you accidently leaked urine? N  Do you have problems with loss of bowel control? N  Managing your Medications? N  Managing your Finances? N  Housekeeping or managing your Housekeeping? N    Patient Care Team: Jeoffrey Massed, MD as PCP - General (  Family Medicine) Pyrtle, Carie Caddy, MD as Consulting Physician (Gastroenterology)  Indicate any recent Medical Services you may have received from other than [facility] providers in the past year (date may be approximate).     Assessment:   This is a routine wellness examination for Catherine Daniel.  Hearing/Vision screen Hearing Screening - Comments:: No trouble hearing Vision Screening - Comments:: Up to date [facility]   Goals Addressed             This Visit's Progress    Patient Stated       Stay healthy        Depression Screen    03/04/2024   11:00 AM 09/25/2023    2:06 PM 03/21/2023    1:18 PM 02/27/2023   10:55 AM 02/08/2022    3:40 PM 06/22/2021   10:58 AM 01/22/2020   10:57 AM  PHQ 2/9 Scores  PHQ - 2 Score 0 0 0 0 0 0 0  PHQ- 9 Score 0      0    Fall Risk    03/04/2024   10:53 AM 09/25/2023    2:06 PM 03/21/2023    1:18 PM 02/27/2023   10:57 AM 02/08/2022    3:48 PM  Fall Risk   Falls in the past year? 0 0 0 0 0  Number falls in past yr: 0  0 0 0  Injury with Fall? 0  0 0 0  Risk for fall due to : Impaired balance/gait  No Fall Risks Impaired vision;Impaired balance/gait   Follow up Falls evaluation completed;Education provided;Falls prevention discussed Falls evaluation completed Falls evaluation completed Falls prevention discussed Falls evaluation completed    MEDICARE  RISK AT HOME: Medicare Risk at Home Any stairs in or around the home?: Yes If so, are there any without handrails?: No Home free of loose throw rugs in walkways, pet beds, electrical cords, etc?: Yes Adequate lighting in your home to reduce risk of falls?: Yes Life alert?: No Use of a cane, walker or w/c?: Yes Grab bars in the bathroom?: No Shower chair or bench in shower?: Yes Elevated toilet seat or a handicapped toilet?: Yes  TIMED UP AND GO:  Was the test performed?  No    Cognitive Function:        03/04/2024   10:58 AM 02/27/2023   10:58 AM 02/08/2022    3:54 PM  6CIT Screen  What Year? 0 points 0 points 0 points  What month? 0 points 0 points 0 points  What time? 0 points 0 points 0 points  Count back from 20 0 points 0 points 0 points  Months in reverse 0 points 0 points 0 points  Repeat phrase 0 points 0 points 0 points  Total Score 0 points 0 points 0 points    Immunizations Immunization History  Administered Date(s) Administered   PFIZER(Purple Top)SARS-COV-2 Vaccination 01/31/2020, 03/01/2020, 10/04/2020   Pneumococcal Conjugate-13 05/13/2014   Pneumococcal Polysaccharide-23 05/07/2011   Td 04/02/2008   Tdap 07/27/2018    TDAP status: Up to date  Flu Vaccine status: Up to date  Pneumococcal vaccine status: Up to date  Covid-19 vaccine status: Information provided on how to obtain vaccines.   Qualifies for Shingles Vaccine? Yes   Zostavax completed No   Shingrix Completed?: No.    Education has been provided regarding the importance of this vaccine. Patient has been advised to call insurance company to determine out of pocket expense if they  have not yet received this vaccine. Advised may also receive vaccine at local pharmacy or Health Dept. Verbalized acceptance and understanding.  Screening Tests Health Maintenance  Topic Date Due   Zoster Vaccines- Shingrix (1 of 2) Never done   COVID-19 Vaccine (4 - 2024-25 season) 08/04/2023   OPHTHALMOLOGY  EXAM  10/16/2023   DEXA SCAN  03/20/2024 (Originally 04/27/2010)   HEMOGLOBIN A1C  03/25/2024   INFLUENZA VACCINE  07/03/2024   Diabetic kidney evaluation - eGFR measurement  09/24/2024   FOOT EXAM  09/24/2024   Diabetic kidney evaluation - Urine ACR  10/06/2024   Medicare Annual Wellness (AWV)  03/04/2025   DTaP/Tdap/Td (3 - Td or Tdap) 07/27/2028   Pneumonia Vaccine 40+ Years old  Completed   HPV VACCINES  Aged Out   Colonoscopy  Discontinued   Hepatitis C Screening  Discontinued    Health Maintenance  Health Maintenance Due  Topic Date Due   Zoster Vaccines- Shingrix (1 of 2) Never done   COVID-19 Vaccine (4 - 2024-25 season) 08/04/2023   OPHTHALMOLOGY EXAM  10/16/2023    Colorectal cancer screening: No longer required.   Mammogram status: No longer required due to  .  Bone Density   Education provided  Lung Cancer Screening: (Low Dose CT Chest recommended if Age 55-80 years, 20 pack-year currently smoking OR have quit w/in 15years.) does not qualify.   Lung Cancer Screening Referral:   Additional Screening:  Hepatitis C Screening: does not qualify; Completed 2024  Vision Screening: Recommended annual ophthalmology exams for early detection of glaucoma and other disorders of the eye. Is the patient up to date with their annual eye exam?  Yes  Who is the provider or what is the name of the office in which the patient attends annual eye exams? [facility] If pt is not established with a provider, would they like to be referred to a provider to establish care? No .   Dental Screening: Recommended annual dental exams for proper oral hygiene    Community Resource Referral / Chronic Care Management: CRR required this visit?  No   CCM required this visit?  No     Plan:     I have personally reviewed and noted the following in the patient's chart:   Medical and social history Use of alcohol, tobacco or illicit drugs  Current medications and supplements including  opioid prescriptions. Patient is not currently taking opioid prescriptions. Functional ability and status Nutritional status Physical activity Advanced directives List of other physicians Hospitalizations, surgeries, and ER visits in previous 12 months Vitals Screenings to include cognitive, depression, and falls Referrals and appointments  In addition, I have reviewed and discussed with patient certain preventive protocols, quality metrics, and best practice recommendations. A written personalized care plan for preventive services as well as general preventive health recommendations were provided to patient.     Remi Haggard, LPN   12/08/1094   After Visit Summary: (MyChart) Due to this being a telephonic visit, the after visit summary with patients personalized plan was offered to patient via MyChart   Nurse Notes:

## 2024-03-25 ENCOUNTER — Ambulatory Visit (INDEPENDENT_AMBULATORY_CARE_PROVIDER_SITE_OTHER): Payer: Medicare Other | Admitting: Family Medicine

## 2024-03-25 ENCOUNTER — Encounter: Payer: Self-pay | Admitting: Family Medicine

## 2024-03-25 VITALS — BP 126/80 | HR 66 | Temp 97.8°F | Ht 63.5 in | Wt 187.0 lb

## 2024-03-25 DIAGNOSIS — E7800 Pure hypercholesterolemia, unspecified: Secondary | ICD-10-CM | POA: Diagnosis not present

## 2024-03-25 DIAGNOSIS — I10 Essential (primary) hypertension: Secondary | ICD-10-CM

## 2024-03-25 DIAGNOSIS — E119 Type 2 diabetes mellitus without complications: Secondary | ICD-10-CM

## 2024-03-25 LAB — LIPID PANEL
Cholesterol: 173 mg/dL (ref 0–200)
HDL: 45.4 mg/dL (ref >39.00)
LDL Cholesterol: 108 mg/dL — ABNORMAL HIGH (ref 0–99)
NonHDL: 127.58
Total CHOL/HDL Ratio: 4
Triglycerides: 99 mg/dL (ref 0.0–149.0)
VLDL: 19.8 mg/dL (ref 0.0–40.0)

## 2024-03-25 LAB — COMPREHENSIVE METABOLIC PANEL WITH GFR
ALT: 20 U/L (ref 0–35)
AST: 25 U/L (ref 0–37)
Albumin: 4.4 g/dL (ref 3.5–5.2)
Alkaline Phosphatase: 98 U/L (ref 39–117)
BUN: 14 mg/dL (ref 6–23)
CO2: 27 meq/L (ref 19–32)
Calcium: 10.1 mg/dL (ref 8.4–10.5)
Chloride: 103 meq/L (ref 96–112)
Creatinine, Ser: 0.66 mg/dL (ref 0.40–1.20)
GFR: 83.73 mL/min (ref >60.00)
Glucose, Bld: 117 mg/dL — ABNORMAL HIGH (ref 70–99)
Potassium: 4.4 meq/L (ref 3.5–5.1)
Sodium: 138 meq/L (ref 135–145)
Total Bilirubin: 1.2 mg/dL (ref 0.2–1.2)
Total Protein: 7 g/dL (ref 6.0–8.3)

## 2024-03-25 LAB — HEMOGLOBIN A1C: Hgb A1c MFr Bld: 7.2 % — ABNORMAL HIGH (ref 4.6–6.5)

## 2024-03-25 MED ORDER — LISINOPRIL 20 MG PO TABS: 20.0000 mg | ORAL_TABLET | Freq: Every day | ORAL | 1 refills | Status: DC

## 2024-03-25 MED ORDER — ATORVASTATIN CALCIUM 20 MG PO TABS: 20.0000 mg | ORAL_TABLET | Freq: Every day | ORAL | 1 refills | Status: DC

## 2024-03-25 MED ORDER — AMLODIPINE BESYLATE 5 MG PO TABS: 5.0000 mg | ORAL_TABLET | Freq: Every day | ORAL | 1 refills | Status: DC

## 2024-03-25 NOTE — Progress Notes (Signed)
 OFFICE VISIT  03/25/2024  CC:  Chief Complaint  Patient presents with   Medical Management of Chronic Issues    Pt is fating    Patient is a 78 y.o. female who presents for 63-month follow-up diabetes, hypertension, and hypercholesterolemia. A/P as of last visit: "1 diabetes without complication, diet controlled. Point-of-care hemoglobin A1c is 6.5% today. Feet exam today normal. Urine microalbumin/creatinine today.   2.  Hypertension, well-controlled on amlodipine  5 mg a day and lisinopril  20 mg a day. Electrolytes and creatinine monitoring today.   Hypercholesterolemia, doing well long-term on a atorvastatin  20 mg a day. Lipid panel today.   3. preventative health:  Mammogram UTD 09/04/23. She continues to decline all other screening ( DEXA, Hep C, pap smears, colon ca screening) and vaccines."  INTERIM HX: Edgar is feeling well. Home blood pressure 117/72 last night. She gets very nervous coming to the doctor.  ROS -> no fevers, no CP, no SOB, no wheezing, no cough, no dizziness, no HAs, no rashes, no melena/hematochezia.  No polyuria or polydipsia.  No myalgias or arthralgias.  No focal weakness, paresthesias, or tremors.  No acute vision or hearing abnormalities.  No dysuria or unusual/new urinary urgency or frequency.  No recent changes in lower legs. No n/v/d or abd pain.  No palpitations.    Past Medical History:  Diagnosis Date   Diabetes mellitus type II    Highest A1c 6.6% in 2014.  Diet-controlled.   Diverticulosis    History of cardiac catheterization 1999   normal   Hypercalcemia    PTH normal 10/2024   Hyperlipidemia    Hypertension    Obesity, Class II, BMI 35-39.9    Osteoarthritis of both knees    inj on R no help in remote past (ortho in [place] but pt cannot recall name)   Situational anxiety    Flying, heights, dentists: xanax prn works well   Tubular adenoma of colon 2011   At 09/19/16 GI visit she decided against repeat colonoscopy, and decided to  pursue annual iFOB testing instead (Dr. Bridgett Camps) (iFOB neg 09/2016)    Past Surgical History:  Procedure Laterality Date   CHOLECYSTECTOMY  1997   for cholelithiasis   COLONOSCOPY  05/31/2010   Descending colon and sigmoid diverticulosis-severe.  Polypectomy x 1.  Recall 5 yrs (Dr. Adan Holms).  Pt chose not to get repeat colonoscopy due to bad experience with procedure in 2011.  She will do annal iFOB testing---has agreed to do colonoscopy IF one returns positive.   TONSILLECTOMY      Outpatient Medications Prior to Visit  Medication Sig Dispense Refill   magnesium oxide (MAG-OX) 400 (240 Mg) MG tablet Take 400 mg by mouth daily.     Omega-3 Fatty Acids (FISH OIL) 1000 MG CAPS Take by mouth daily.       amLODipine  (NORVASC ) 5 MG tablet Take 1 tablet (5 mg total) by mouth daily. 90 tablet 1   atorvastatin  (LIPITOR) 20 MG tablet Take 1 tablet (20 mg total) by mouth daily. 90 tablet 1   Calcium  Carb-Cholecalciferol (CALCIUM -VITAMIN D ) 600-400 MG-UNIT TABS Take 1 tablet by mouth 2 (two) times daily. (Patient not taking: Reported on 03/25/2024)     lisinopril  (ZESTRIL ) 20 MG tablet Take 1 tablet (20 mg total) by mouth daily. 90 tablet 1   No facility-administered medications prior to visit.    No Known Allergies  Review of Systems As per HPI  PE:    03/25/2024    1:28 PM  03/25/2024    1:09 PM 09/25/2023    2:02 PM  Vitals with BMI  Height  5' 3.5"   Weight  187 lbs   BMI  32.6   Systolic 126 158 725  Diastolic 80 80 67  Pulse  66      Physical Exam  Gen: Alert, well appearing.  Patient is oriented to person, place, time, and situation. AFFECT: pleasant, lucid thought and speech. CV: RRR, occ ectopy.  No m/r/g LUNGS: Clear bilaterally, nonlabored respirations. Extremities: No edema.  LABS:  Last CBC Lab Results  Component Value Date   WBC 6.6 01/11/2016   HGB 14.5 01/11/2016   HCT 44.7 01/11/2016   MCV 81.1 01/11/2016   RDW 14.6 01/11/2016   PLT 136.0 (L)  01/11/2016   Last metabolic panel Lab Results  Component Value Date   GLUCOSE 114 (H) 09/25/2023   NA 138 09/25/2023   K 4.6 09/25/2023   CL 101 09/25/2023   CO2 28 09/25/2023   BUN 15 09/25/2023   CREATININE 0.77 09/25/2023   GFR 73.89 09/25/2023   CALCIUM  10.8 (H) 10/07/2023   PHOS 3.7 09/27/2023   PROT 7.2 03/21/2023   ALBUMIN 4.5 03/21/2023   BILITOT 0.8 03/21/2023   ALKPHOS 77 03/21/2023   AST 21 03/21/2023   ALT 21 03/21/2023   Last lipids Lab Results  Component Value Date   CHOL 177 09/25/2023   HDL 55.10 09/25/2023   LDLCALC 96 09/25/2023   TRIG 130.0 09/25/2023   CHOLHDL 3 09/25/2023   Last hemoglobin A1c Lab Results  Component Value Date   HGBA1C 6.5 (A) 09/25/2023   HGBA1C 6.5 09/25/2023   HGBA1C 6.5 (A) 09/25/2023   HGBA1C 6.5 09/25/2023   Last thyroid  functions Lab Results  Component Value Date   TSH 1.61 01/11/2016   Last vitamin D  Lab Results  Component Value Date   VD25OH 35 09/27/2023   Lab Results  Component Value Date   PTH 74 10/07/2023   CALCIUM  10.8 (H) 10/07/2023   PHOS 3.7 09/27/2023   IMPRESSION AND PLAN:  1 diabetes without complication, diet controlled. Check Hba1c today.  2.  Hypertension, well-controlled on amlodipine  5 mg a day and lisinopril  20 mg a day. Electrolytes and creatinine monitoring today.   Hypercholesterolemia, doing well long-term on a atorvastatin  20 mg a day. Lipid panel and hepatic panel today.   3. preventative health:  Mammogram UTD 09/04/23. She continues to decline all other screening ( DEXA, Hep C, pap smears, colon ca screening) and vaccines.  #4 hypercalcemia, mild. PTH and phosphorus have been normal. She stopped all calcium  supplement after last visit. Rechecking calcium  today.  An After Visit Summary was printed and given to the patient.  FOLLOW UP: Return in about 6 months (around 09/24/2024) for routine chronic illness f/u.  Signed:  Arletha Lady, MD           03/25/2024

## 2024-03-26 ENCOUNTER — Encounter: Payer: Self-pay | Admitting: Family Medicine

## 2024-09-18 ENCOUNTER — Other Ambulatory Visit: Payer: Self-pay | Admitting: Family Medicine

## 2024-09-25 ENCOUNTER — Ambulatory Visit: Attending: Family Medicine

## 2024-09-25 ENCOUNTER — Encounter: Payer: Self-pay | Admitting: Family Medicine

## 2024-09-25 ENCOUNTER — Ambulatory Visit: Admitting: Family Medicine

## 2024-09-25 VITALS — BP 125/65 | HR 53 | Temp 97.7°F | Ht 63.5 in | Wt 179.4 lb

## 2024-09-25 DIAGNOSIS — R350 Frequency of micturition: Secondary | ICD-10-CM | POA: Diagnosis not present

## 2024-09-25 DIAGNOSIS — E119 Type 2 diabetes mellitus without complications: Secondary | ICD-10-CM

## 2024-09-25 DIAGNOSIS — I10 Essential (primary) hypertension: Secondary | ICD-10-CM | POA: Diagnosis not present

## 2024-09-25 DIAGNOSIS — I493 Ventricular premature depolarization: Secondary | ICD-10-CM | POA: Diagnosis not present

## 2024-09-25 DIAGNOSIS — Z1231 Encounter for screening mammogram for malignant neoplasm of breast: Secondary | ICD-10-CM

## 2024-09-25 DIAGNOSIS — R300 Dysuria: Secondary | ICD-10-CM

## 2024-09-25 DIAGNOSIS — E7800 Pure hypercholesterolemia, unspecified: Secondary | ICD-10-CM | POA: Diagnosis not present

## 2024-09-25 LAB — LIPID PANEL
Cholesterol: 179 mg/dL (ref 0–200)
HDL: 46.2 mg/dL (ref >39.00)
LDL Cholesterol: 110 mg/dL — ABNORMAL HIGH (ref 0–99)
NonHDL: 132.89
Total CHOL/HDL Ratio: 4
Triglycerides: 113 mg/dL (ref 0.0–149.0)
VLDL: 22.6 mg/dL (ref 0.0–40.0)

## 2024-09-25 LAB — POC URINALSYSI DIPSTICK (AUTOMATED)
Bilirubin, UA: NEGATIVE
Blood, UA: NEGATIVE
Glucose, UA: NEGATIVE
Ketones, UA: NEGATIVE
Leukocytes, UA: NEGATIVE
Nitrite, UA: NEGATIVE
Protein, UA: NEGATIVE
Spec Grav, UA: 1.025 (ref 1.010–1.025)
Urobilinogen, UA: 0.2 U/dL
pH, UA: 5.5 (ref 5.0–8.0)

## 2024-09-25 LAB — COMPREHENSIVE METABOLIC PANEL WITH GFR
ALT: 19 U/L (ref 0–35)
AST: 19 U/L (ref 0–37)
Albumin: 4.7 g/dL (ref 3.5–5.2)
Alkaline Phosphatase: 95 U/L (ref 39–117)
BUN: 19 mg/dL (ref 6–23)
CO2: 27 meq/L (ref 19–32)
Calcium: 10.6 mg/dL — ABNORMAL HIGH (ref 8.4–10.5)
Chloride: 102 meq/L (ref 96–112)
Creatinine, Ser: 0.67 mg/dL (ref 0.40–1.20)
GFR: 83.13 mL/min (ref >60.00)
Glucose, Bld: 123 mg/dL — ABNORMAL HIGH (ref 70–99)
Potassium: 4.4 meq/L (ref 3.5–5.1)
Sodium: 140 meq/L (ref 135–145)
Total Bilirubin: 1.2 mg/dL (ref 0.2–1.2)
Total Protein: 7.3 g/dL (ref 6.0–8.3)

## 2024-09-25 LAB — HEMOGLOBIN A1C: Hgb A1c MFr Bld: 6.5 % (ref 4.6–6.5)

## 2024-09-25 LAB — MAGNESIUM: Magnesium: 2.2 mg/dL (ref 1.5–2.5)

## 2024-09-25 LAB — MICROALBUMIN / CREATININE URINE RATIO
Creatinine,U: 80.4 mg/dL
Microalb Creat Ratio: UNDETERMINED mg/g (ref 0.0–30.0)
Microalb, Ur: <0.7 mg/dL

## 2024-09-25 MED ORDER — SULFAMETHOXAZOLE-TRIMETHOPRIM 800-160 MG PO TABS: 1.0000 | ORAL_TABLET | Freq: Two times a day (BID) | ORAL | 0 refills | Status: AC

## 2024-09-25 MED ORDER — AMLODIPINE BESYLATE 5 MG PO TABS: 5.0000 mg | ORAL_TABLET | Freq: Every day | ORAL | 3 refills | Status: AC

## 2024-09-25 MED ORDER — ATORVASTATIN CALCIUM 20 MG PO TABS: 20.0000 mg | ORAL_TABLET | Freq: Every day | ORAL | 3 refills | Status: AC

## 2024-09-25 MED ORDER — LISINOPRIL 20 MG PO TABS: 20.0000 mg | ORAL_TABLET | Freq: Every day | ORAL | 3 refills | Status: AC

## 2024-09-25 NOTE — Progress Notes (Unsigned)
 EP to read.

## 2024-09-25 NOTE — Progress Notes (Addendum)
 OFFICE VISIT  09/25/2024  CC:  Chief Complaint  Patient presents with   Medical Management of Chronic Issues    Patient is a 78 y.o. female who presents for 45-month follow-up diabetes, hypercholesterolemia, and hypertension.  diabetes without complication, diet controlled. Check Hba1c today.   2.  Hypertension, well-controlled on amlodipine  5 mg a day and lisinopril  20 mg a day. Electrolytes and creatinine monitoring today.   Hypercholesterolemia, doing well long-term on a atorvastatin  20 mg a day. Lipid panel and hepatic panel today.   3. preventative health:  Mammogram UTD 09/04/23. She continues to decline all other screening ( DEXA, Hep C, pap smears, colon ca screening) and vaccines.   #4 hypercalcemia, mild. PTH and phosphorus have been normal. She stopped all calcium  supplement after last visit. Rechecking calcium  today.  INTERIM HX: Approximately 2 weeks of increased urinary frequency and urgency.  No dysuria, blood in urine, flank pain, fevers, or abdominal pain.  ROS as above, plus--> no CP, no SOB, no wheezing, no cough, no dizziness, no HAs, no rashes, no melena/hematochezia.  No polyuria or polydipsia.  No myalgias or arthralgias.  No focal weakness, paresthesias, or tremors.  No acute vision or hearing abnormalities.  No palpitations.     Past Medical History:  Diagnosis Date   Diabetes mellitus type II    Highest A1c 6.6% in 2014.  Diet-controlled.   Diverticulosis    History of cardiac catheterization 1999   normal   Hypercalcemia    PTH normal 10/2024.  Ca++ normalized after stopping calcium  supplement.   Hyperlipidemia    Hypertension    Obesity, Class II, BMI 35-39.9    Osteoarthritis of both knees    inj on R no help in remote past (ortho in [place] but pt cannot recall name)   Situational anxiety    Flying, heights, dentists: xanax prn works well   Tubular adenoma of colon 2011   At 09/19/16 GI visit she decided against repeat colonoscopy, and  decided to pursue annual iFOB testing instead (Dr. Albertus) (iFOB neg 09/2016)    Past Surgical History:  Procedure Laterality Date   CHOLECYSTECTOMY  1997   for cholelithiasis   COLONOSCOPY  05/31/2010   Descending colon and sigmoid diverticulosis-severe.  Polypectomy x 1.  Recall 5 yrs (Dr. Jakie).  Pt chose not to get repeat colonoscopy due to bad experience with procedure in 2011.  She will do annal iFOB testing---has agreed to do colonoscopy IF one returns positive.   TONSILLECTOMY      Outpatient Medications Prior to Visit  Medication Sig Dispense Refill   amLODipine  (NORVASC ) 5 MG tablet Take 1 tablet (5 mg total) by mouth daily. 90 tablet 1   atorvastatin  (LIPITOR) 20 MG tablet Take 1 tablet (20 mg total) by mouth daily. 90 tablet 1   lisinopril  (ZESTRIL ) 20 MG tablet Take 1 tablet (20 mg total) by mouth daily. 90 tablet 1   magnesium oxide (MAG-OX) 400 (240 Mg) MG tablet Take 400 mg by mouth daily.     Omega-3 Fatty Acids (FISH OIL) 1000 MG CAPS Take by mouth daily.       No facility-administered medications prior to visit.    No Known Allergies  Review of Systems As per HPI  PE:    09/25/2024   10:44 AM 09/25/2024   10:26 AM 03/25/2024    1:28 PM  Vitals with BMI  Height  5' 3.5   Weight  179 lbs 6 oz   BMI  31.28  Systolic 125 147 873  Diastolic 65 75 80  Pulse  53      Physical Exam  Gen: Alert, well appearing.  Patient is oriented to person, place, time, and situation. AFFECT: pleasant, lucid thought and speech. CV: Regular rhythm with frequent ectopy, rate 65, no murmur Chest is clear, no wheezing or rales. Normal symmetric air entry throughout both lung fields. No chest wall deformities or tenderness. EXT: no clubbing or cyanosis.  no edema.    LABS:  Last CBC Lab Results  Component Value Date   WBC 6.6 01/11/2016   HGB 14.5 01/11/2016   HCT 44.7 01/11/2016   MCV 81.1 01/11/2016   RDW 14.6 01/11/2016   PLT 136.0 (L) 01/11/2016   Last  metabolic panel Lab Results  Component Value Date   GLUCOSE 117 (H) 03/25/2024   NA 138 03/25/2024   K 4.4 03/25/2024   CL 103 03/25/2024   CO2 27 03/25/2024   BUN 14 03/25/2024   CREATININE 0.66 03/25/2024   GFR 83.73 03/25/2024   CALCIUM  10.1 03/25/2024   PHOS 3.7 09/27/2023   PROT 7.0 03/25/2024   ALBUMIN 4.4 03/25/2024   BILITOT 1.2 03/25/2024   ALKPHOS 98 03/25/2024   AST 25 03/25/2024   ALT 20 03/25/2024   Last lipids Lab Results  Component Value Date   CHOL 173 03/25/2024   HDL 45.40 03/25/2024   LDLCALC 108 (H) 03/25/2024   TRIG 99.0 03/25/2024   CHOLHDL 4 03/25/2024   Last hemoglobin A1c Lab Results  Component Value Date   HGBA1C 7.2 (H) 03/25/2024   IMPRESSION AND PLAN:  #1 Irregular cardiac rhythm, not a-fib. Ordered zio monitoring. Lytes/cr and mag level today.  #2 hypertension, well-controlled on lisinopril  20 mg a day and amlodipine  5mg  qd.  Lytes/cr today.  #3 hypercholesterolemia. Continue atorvastatin  20 mg a day. Lipid panel and hepatic panel today.  4.  Diabetes without complication. Diet controlled. Check hemoglobin A1c today as well as urine microalbumin/creatinine.  #5 Urinary urgency and frequency, onset approx 2 wks ago. UA today is normal. Will treat for UTI based on symptoms (no hx of OAB). Send her urine specimen for culture. Bactrim DS, 1 twice daily x 3 days.  #6 Preventative health: Mammogram 09/2023--> repeat has been scheduled today. She continues to decline all other screening ( DEXA, Hep C, pap smears, colon ca screening) and vaccines.  An After Visit Summary was printed and given to the patient.  FOLLOW UP: No follow-ups on file.  Signed:  Gerlene Hockey, MD           09/25/2024

## 2024-09-28 ENCOUNTER — Ambulatory Visit
Admission: RE | Admit: 2024-09-28 | Discharge: 2024-09-28 | Disposition: A | Source: Ambulatory Visit | Attending: Family Medicine | Admitting: Family Medicine

## 2024-09-28 ENCOUNTER — Ambulatory Visit: Payer: Self-pay | Admitting: Family Medicine

## 2024-09-28 DIAGNOSIS — Z1231 Encounter for screening mammogram for malignant neoplasm of breast: Secondary | ICD-10-CM

## 2024-09-29 NOTE — Telephone Encounter (Signed)
 Results given to pt. No questions or concerns from pt.

## 2024-10-14 DIAGNOSIS — I493 Ventricular premature depolarization: Secondary | ICD-10-CM | POA: Diagnosis not present

## 2024-10-15 DIAGNOSIS — I493 Ventricular premature depolarization: Secondary | ICD-10-CM | POA: Diagnosis not present

## 2024-10-19 ENCOUNTER — Encounter: Payer: Self-pay | Admitting: Family Medicine

## 2025-03-10 ENCOUNTER — Encounter

## 2025-03-26 ENCOUNTER — Ambulatory Visit: Admitting: Family Medicine
